# Patient Record
Sex: Female | Born: 1995 | Race: White | Hispanic: No | Smoking: Never smoker
Health system: Southern US, Community
[De-identification: ages and names within clinical notes are randomized; demographics above are authoritative.]

## PROBLEM LIST (undated history)

## (undated) DIAGNOSIS — R569 Unspecified convulsions: Secondary | ICD-10-CM

## (undated) DIAGNOSIS — D649 Anemia, unspecified: Secondary | ICD-10-CM

## (undated) DIAGNOSIS — F419 Anxiety disorder, unspecified: Secondary | ICD-10-CM

## (undated) DIAGNOSIS — K219 Gastro-esophageal reflux disease without esophagitis: Secondary | ICD-10-CM

## (undated) DIAGNOSIS — T1491XA Suicide attempt, initial encounter: Secondary | ICD-10-CM

## (undated) HISTORY — PX: NO PAST SURGERIES: SHX2092

---
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
## 2013-03-28 IMAGING — US ABDOMEN ULTRASOUND LIMITED
1 series · 14 of 23 positions shown · non-contrast
Comparison: none

REASON FOR EXAM: abd pain esp ruq and mildly elevated alk phos
COMMENTS:   Body Site: GB and Fossa, CBD, Head of Pancreas

[Series 1: abdomen ultrasound limited · 0.31mm/px · 14 of 23 slices shown]
[im 1/23]
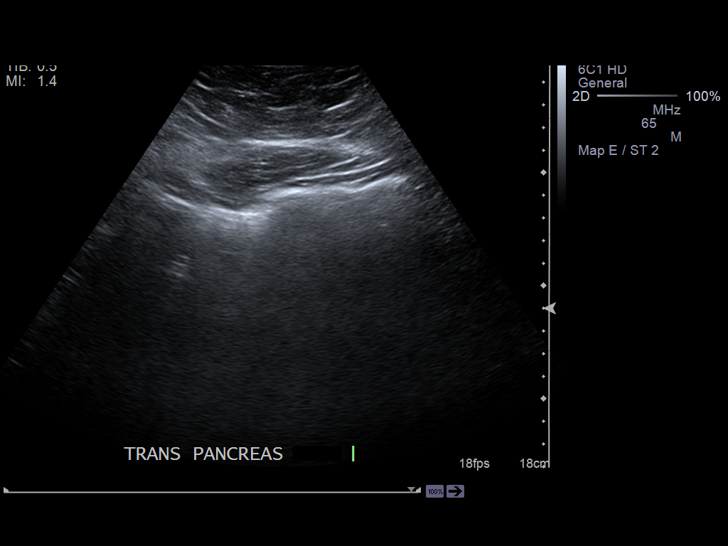
[im 3/23]
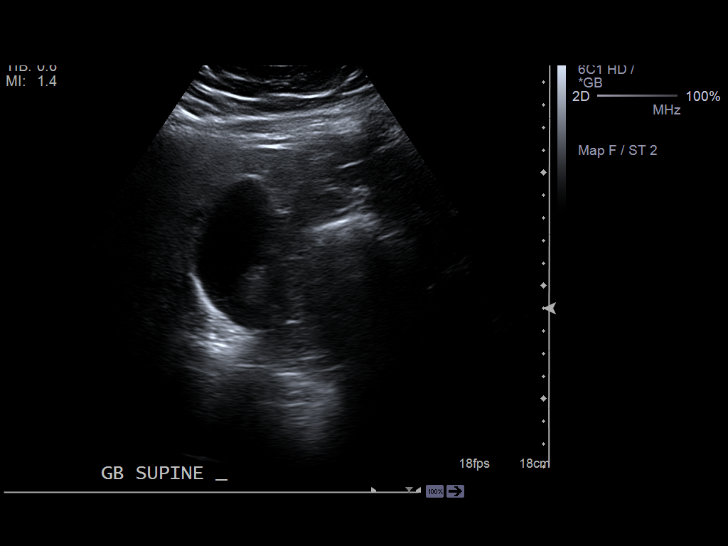
[im 5/23]
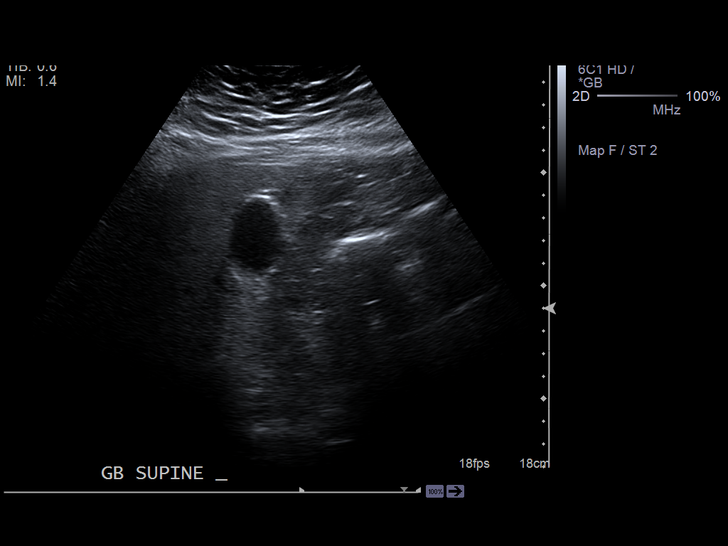
[im 6/23]
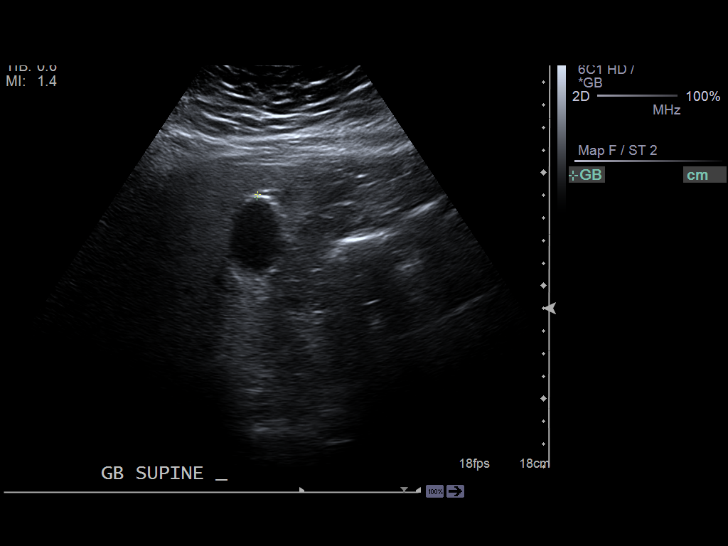
[im 8/23]
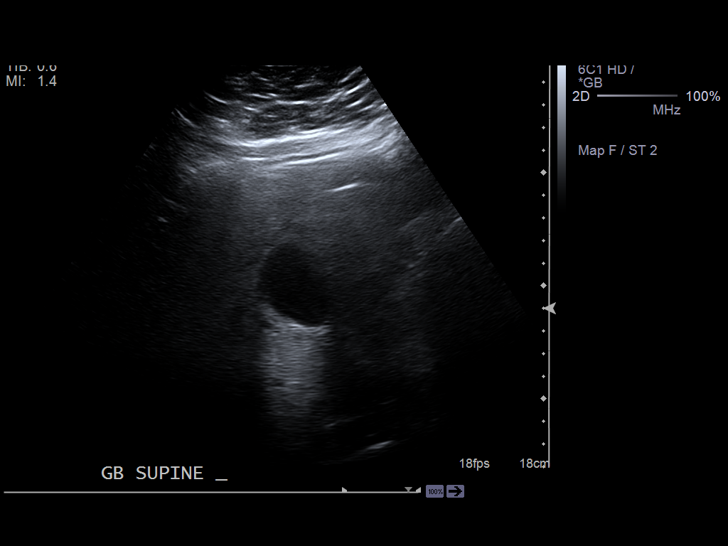
[im 10/23]
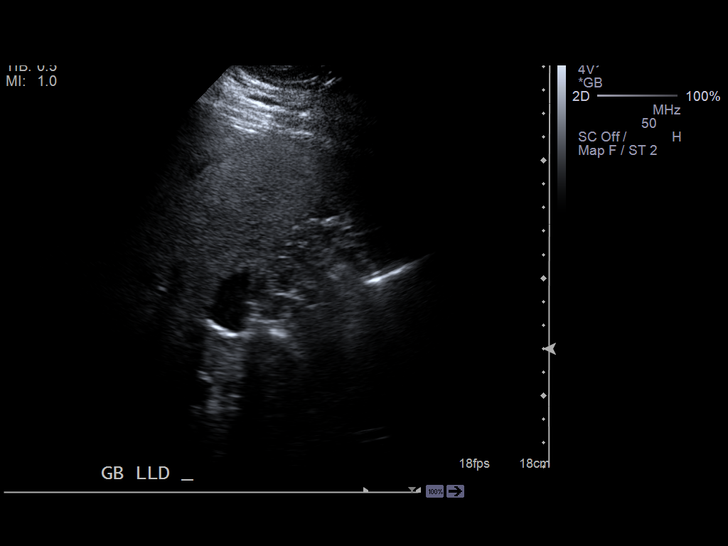
[im 11/23]
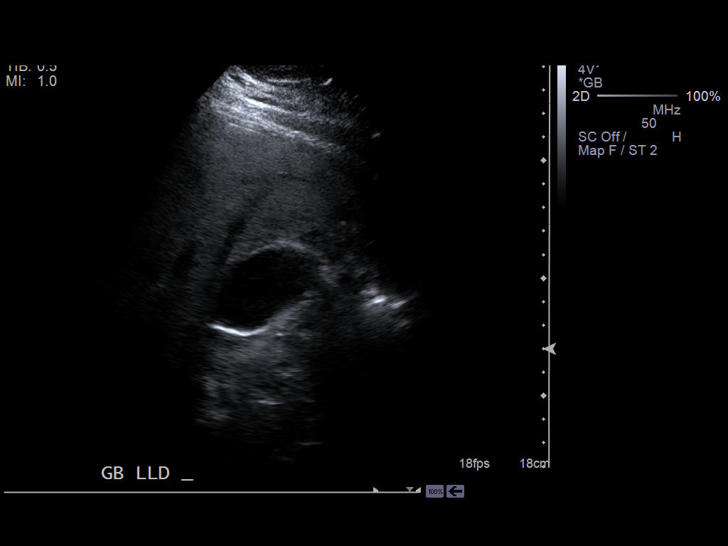
[im 13/23]
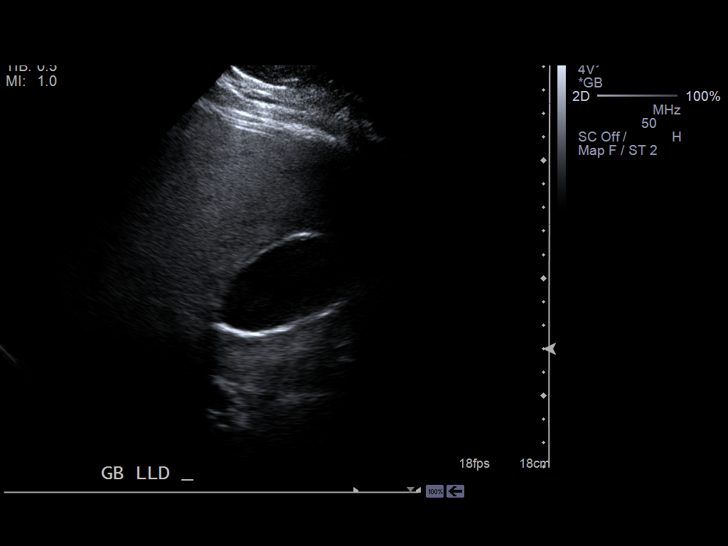
[im 14/23]
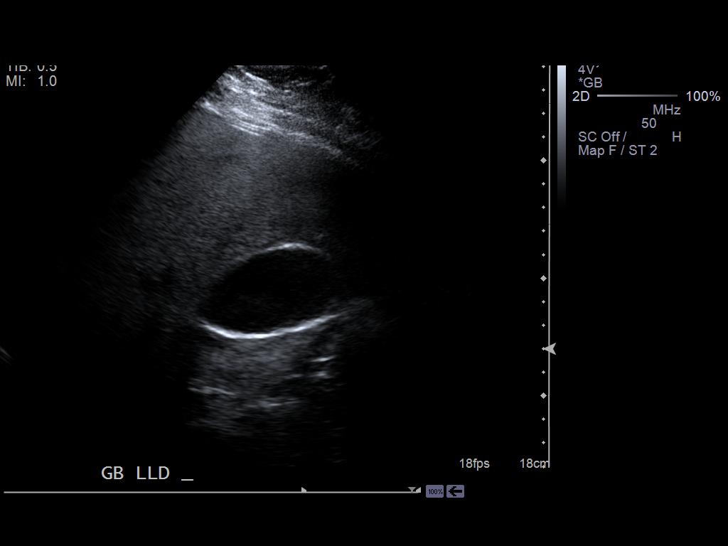
[im 16/23]
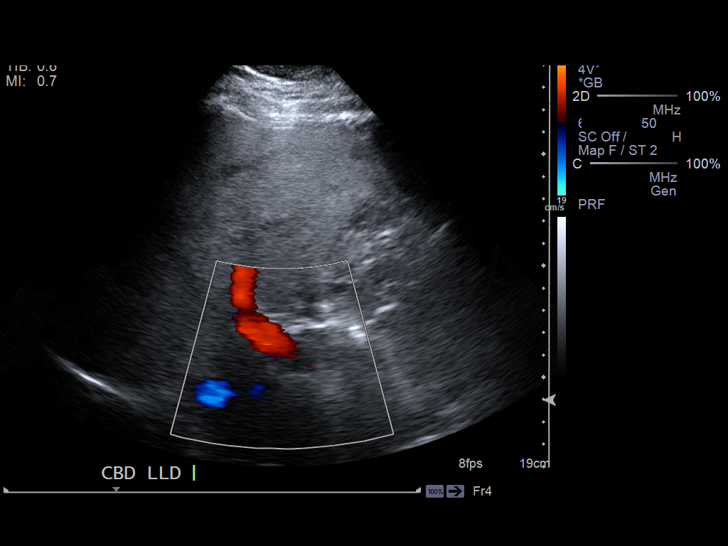
[im 18/23]
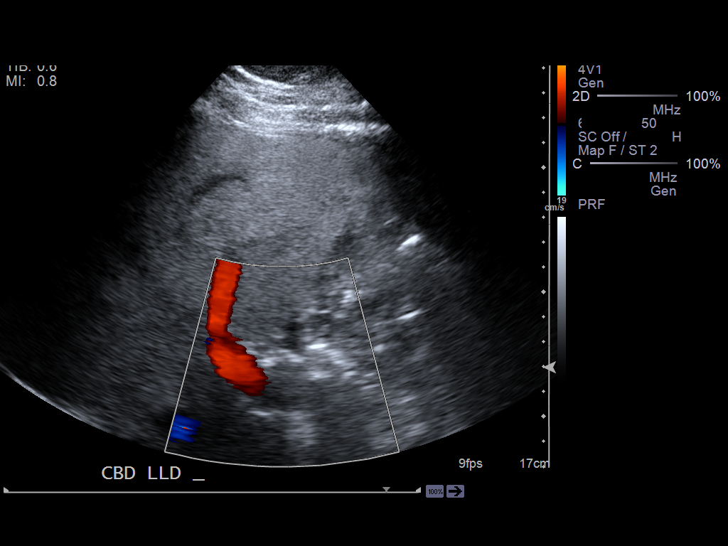
[im 19/23]
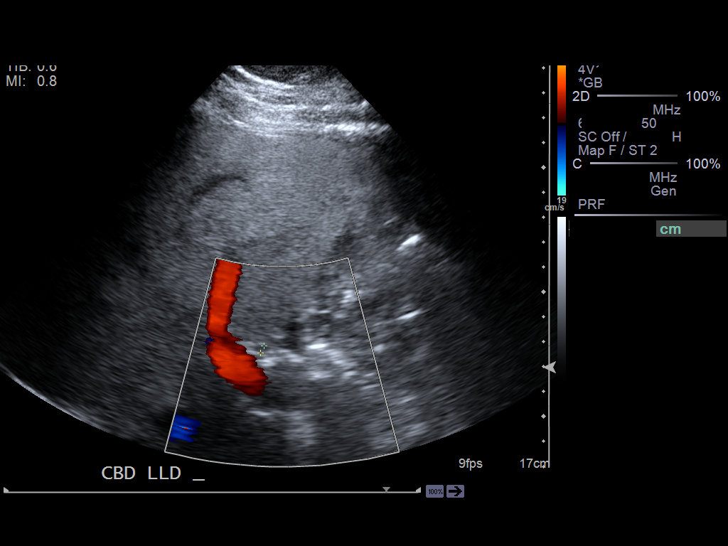
[im 21/23]
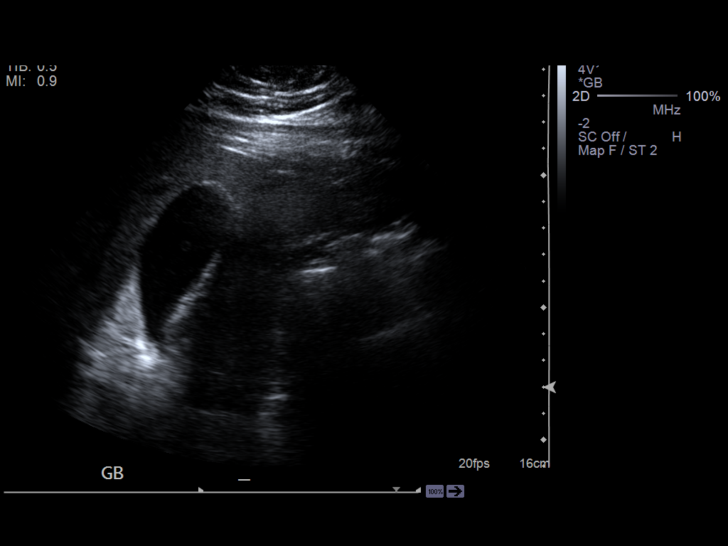
[im 23/23]
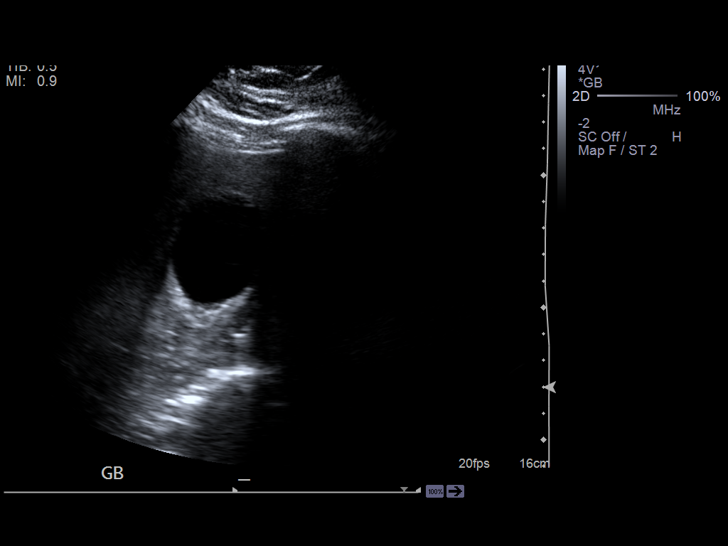

[14 of 23 positions shown; findings below may reference images not displayed]

PROCEDURE:     US  - US ABDOMEN LIMITED SURVEY  - February 10, 2012  [DATE]

RESULT:     A limited right upper quadrant ultrasound was performed.

The gallbladder is adequately distended with no evidence of stones, wall
thickening, or pericholecystic fluid. There is no positive sonographic
Murphy's sign. The common bile duct is normal at 3.2 mm in diameter. The
observed portions of the liver exhibit no acute abnormality. The pancreatic
head could not be demonstrated due to the presence of bowel gas.
IMPRESSION: The gallbladder and common bile duct are normal in
appearance. The  pancreatic head could not be demonstrated due to bowel gas.

[REDACTED]

## 2013-11-07 IMAGING — CT CT HEAD WITHOUT CONTRAST
1 series · 16 of 30 positions shown, 20 images · non-contrast
Comparison: none

REASON FOR EXAM: ha
COMMENTS:

PROCEDURE:     CT  - CT HEAD WITHOUT CONTRAST  - September 21, 2012  [DATE]
RESULT:     Comparison:  03/17/2010
TECHNIQUE: Multiple axial images from the foramen magnum to the vertex were
obtained without IV contrast.

[Series 2: soft tissue · axial · 0.39mm/px · z∈[-174,-24]mm · 16 of 34 slices shown, 20 images]
[im 2/34  brain]
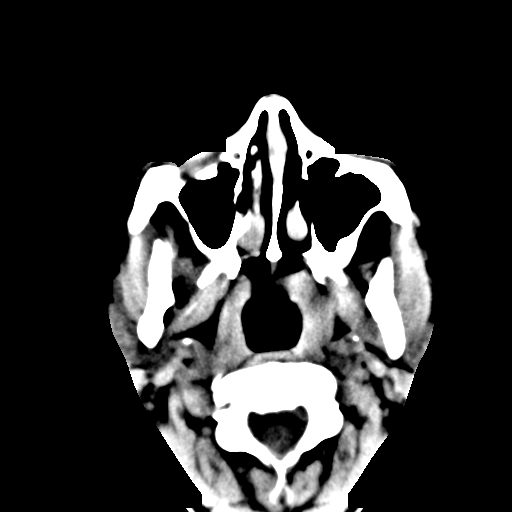
[im 2/34  bone]
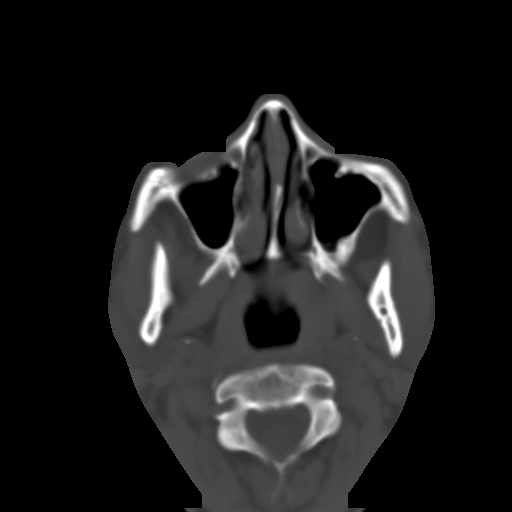
[im 4/34  brain]
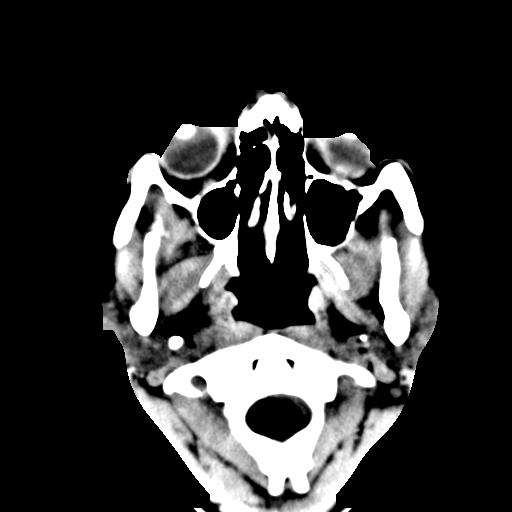
[im 6/34  brain]
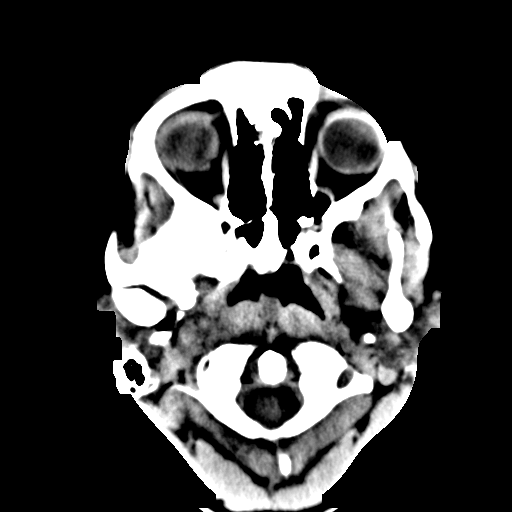
[im 8/34  brain]
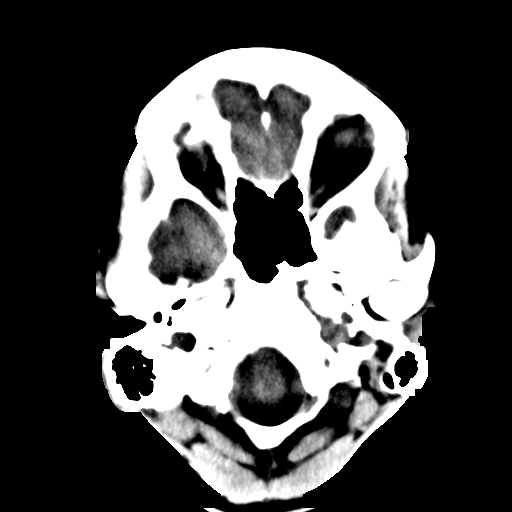
[im 10/34  brain]
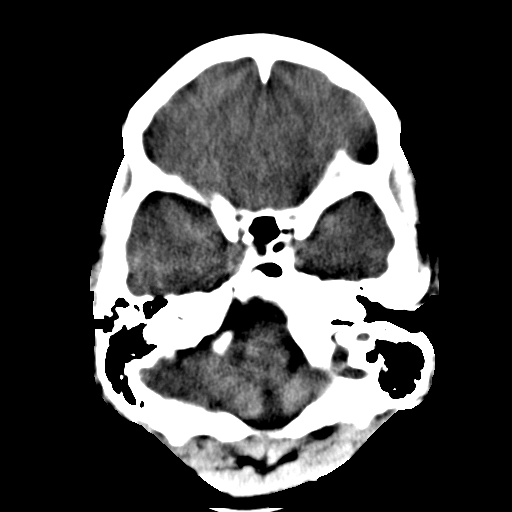
[im 10/34  bone]
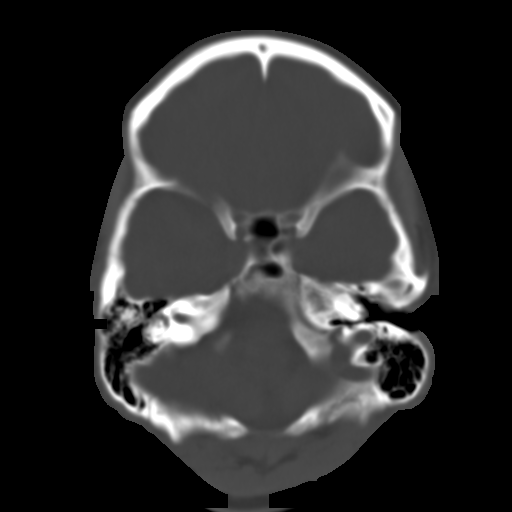
[im 12/34  brain]
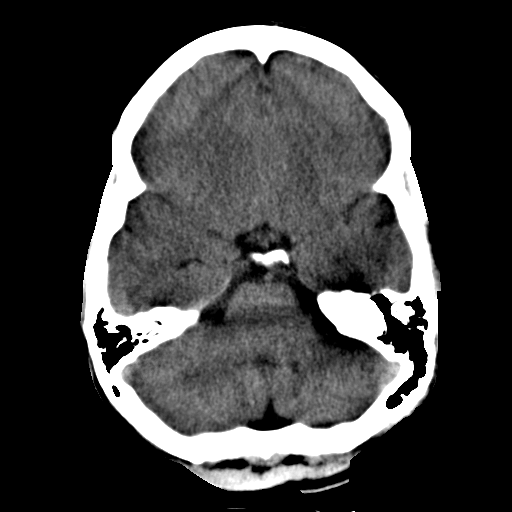
[im 14/34  brain]
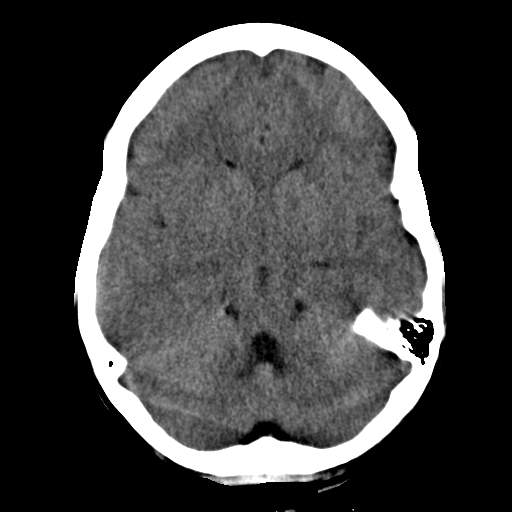
[im 16/34  brain]
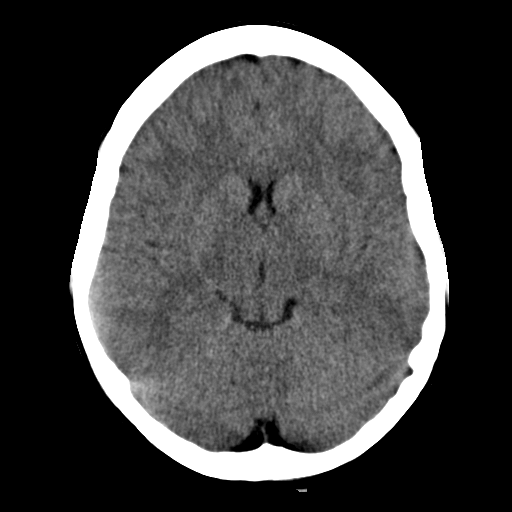
[im 18/34  brain]
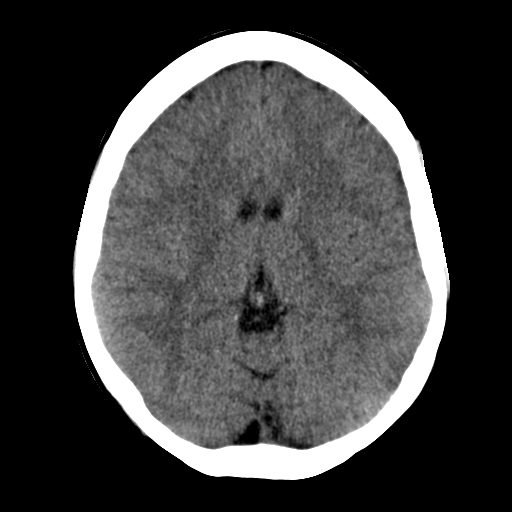
[im 18/34  bone]
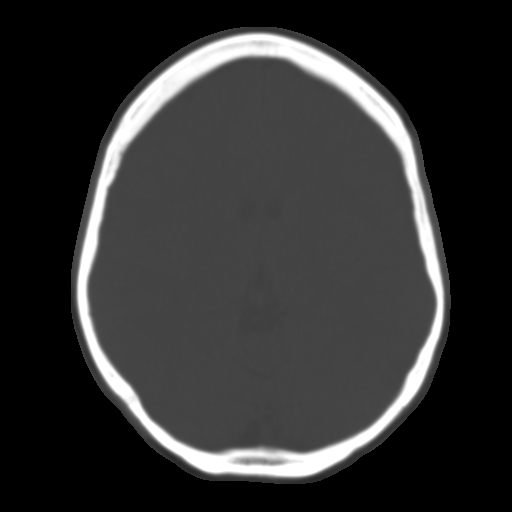
[im 20/34  brain]
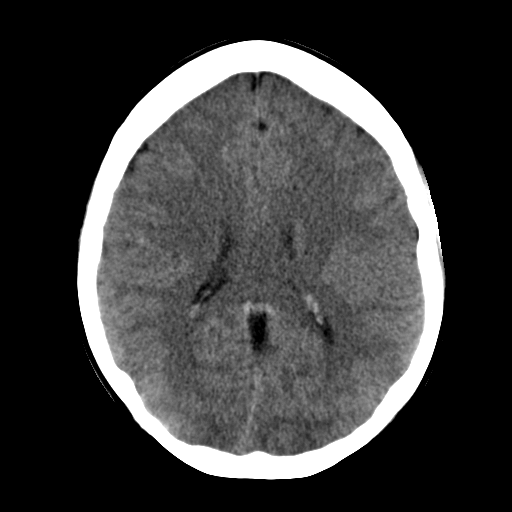
[im 22/34  brain]
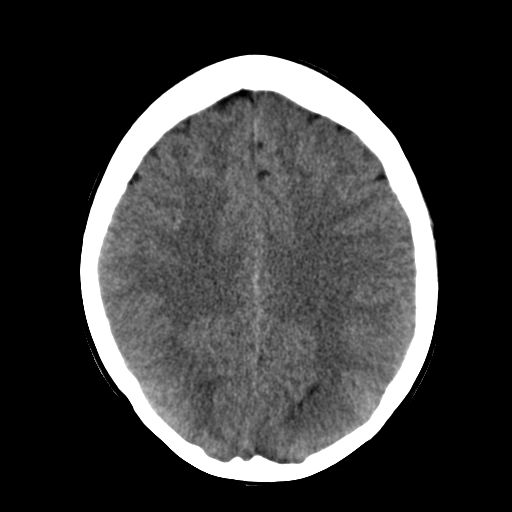
[im 24/34  brain]
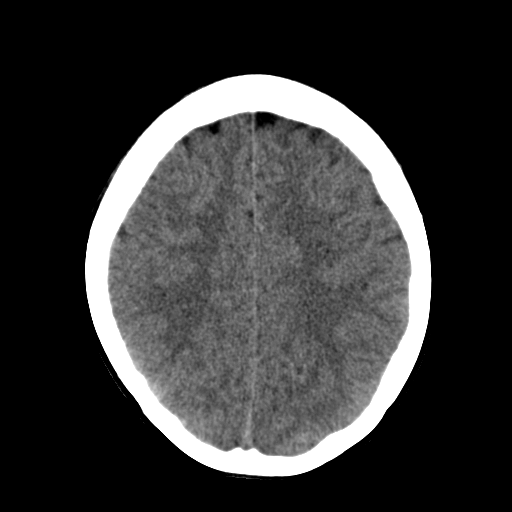
[im 26/34  brain]
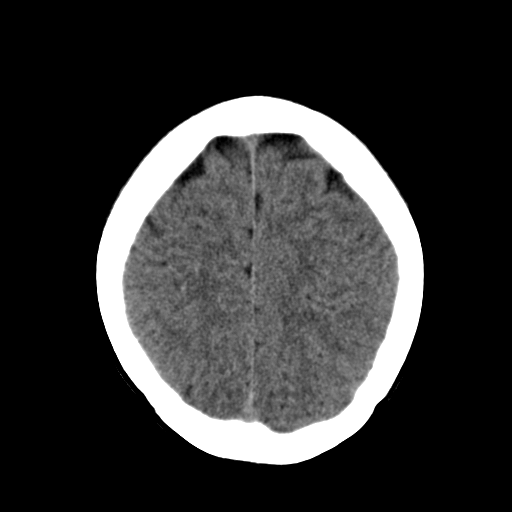
[im 26/34  bone]
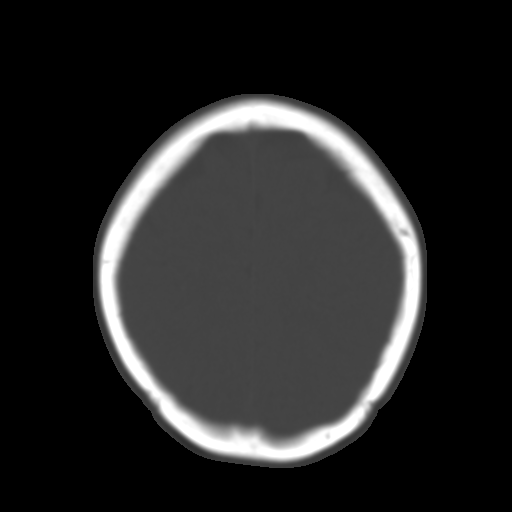
[im 28/34  brain]
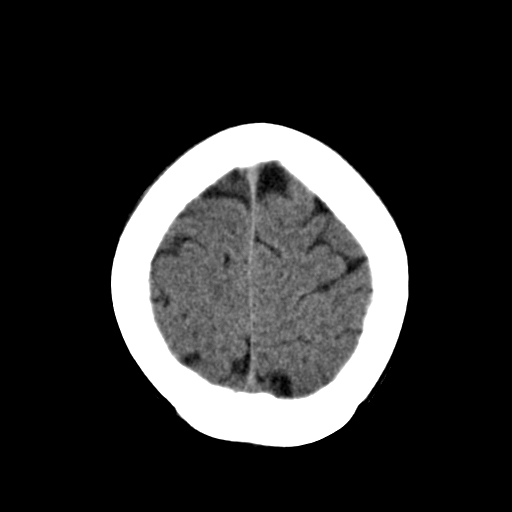
[im 30/34  brain]
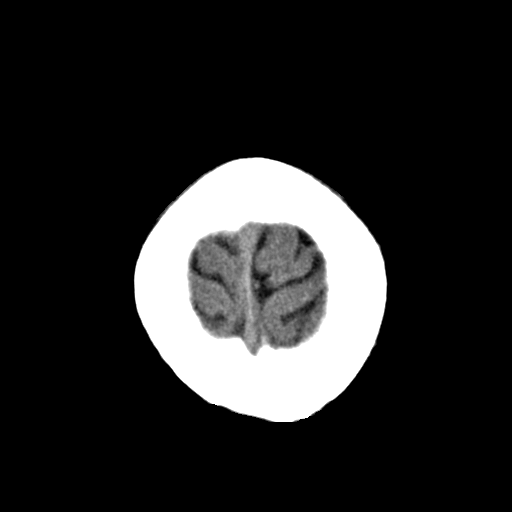
[im 32/34  brain]
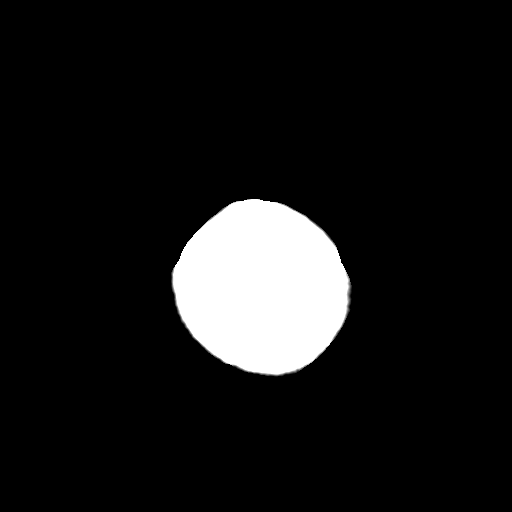

[16 of 30 positions shown; findings below may reference images not displayed]

FINDINGS: There is no evidence for mass effect, midline shift, or extra-axial fluid
collections. There is no evidence for space-occupying lesion, intracranial
hemorrhage, or cortical-based area of infarction.

Small mucus retention cyst is incompletely last in the right maxillary sinus.

The osseous structures are unremarkable.
IMPRESSION: No acute intracranial process.

## 2013-11-27 ENCOUNTER — Emergency Department: Payer: Self-pay | Admitting: Emergency Medicine

## 2013-11-27 LAB — URINALYSIS, COMPLETE
BILIRUBIN, UR: NEGATIVE
GLUCOSE, UR: NEGATIVE mg/dL (ref 0–75)
Ketone: NEGATIVE
Nitrite: NEGATIVE
Ph: 5 (ref 4.5–8.0)
Specific Gravity: 1.017 (ref 1.003–1.030)
WBC UR: 1145 /HPF (ref 0–5)

## 2013-11-27 LAB — COMPREHENSIVE METABOLIC PANEL
ALBUMIN: 4.1 g/dL (ref 3.4–5.0)
ALT: 19 U/L (ref 12–78)
Alkaline Phosphatase: 95 U/L
Anion Gap: 6 — ABNORMAL LOW (ref 7–16)
BUN: 10 mg/dL (ref 7–18)
Bilirubin,Total: 0.6 mg/dL (ref 0.2–1.0)
CHLORIDE: 112 mmol/L — AB (ref 98–107)
Calcium, Total: 8.7 mg/dL (ref 8.5–10.1)
Co2: 22 mmol/L (ref 21–32)
Creatinine: 0.78 mg/dL (ref 0.60–1.30)
EGFR (African American): >60
EGFR (Non-African Amer.): >60
GLUCOSE: 93 mg/dL (ref 65–99)
Osmolality: 278 (ref 275–301)
POTASSIUM: 3.5 mmol/L (ref 3.5–5.1)
SGOT(AST): 17 U/L (ref 15–37)
SODIUM: 140 mmol/L (ref 136–145)
TOTAL PROTEIN: 7.7 g/dL (ref 6.4–8.2)

## 2013-11-27 LAB — CBC
HCT: 41.5 % (ref 35.0–47.0)
HGB: 13.7 g/dL (ref 12.0–16.0)
MCH: 29.5 pg (ref 26.0–34.0)
MCHC: 33 g/dL (ref 32.0–36.0)
MCV: 89 fL (ref 80–100)
PLATELETS: 228 10*3/uL (ref 150–440)
RBC: 4.64 10*6/uL (ref 3.80–5.20)
RDW: 12.3 % (ref 11.5–14.5)
WBC: 11.2 10*3/uL — AB (ref 3.6–11.0)

## 2013-11-30 LAB — URINE CULTURE

## 2014-11-23 NOTE — H&P (Signed)
L&D Evaluation:  History Expanded:   HPI 19 yo G1P0 at 1039 weeks with EDD of 12/13/11 per 7 week US. [facility]  [facility], notable for early entry to care, h/o seizures without meds during pregnancy (last seizure was 2 years ago), poor weight gain.   [facility]. EFW 6#9oz, AFI 5.8 cm, S/D Ratio 3.7. Induction recommended per Dr Janene HarveyKlett.    Blood Type A positive    Group B Strep Results (Result >5wks must be treated as unknown) positive    Maternal HIV Negative    Maternal Syphilis Ab Nonreactive    Maternal Varicella Immune    Rubella Results immune    Maternal T-Dap Immune    Patient's Medical History Epilepsy  no seizure x2 years, no meds in pregnancy    Patient's Surgical History none    Medications Pre Natal Vitamins    Allergies NKDA    Social History none    Family History Non-Contributory   ROS:   ROS see HPI   Exam:   Vital Signs stable    General no apparent distress    Mental Status clear    Chest clear    Heart no murmur/gallop/rubs    Abdomen gravid, non-tender    Estimated Fetal Weight Average for gestational age    Edema no edema    Reflexes 2+    Pelvic no external lesions, 1/90/0, posterior    Mebranes Intact    FHT normal rate with no decels    Ucx irregular   Impression:   Impression IOL for oligohydramnios   Plan:   Comments Cervidil this evening, continue monitoring through afternoon d/t low AFI.  Discussed R/B/A of induction with Cervidil and Pitocin including fetal distress, tachysytole and c-section. Pt and mother in agreement with plan.   Electronic Signatures: Vella KohlerBrothers, Truxton Stupka K (CNM)  (Signed 23-May-13 15:34)  Authored: L&D Evaluation   Last Updated: 23-May-13 15:34 by Vella KohlerBrothers, Hilja Kintzel K (CNM)

## 2014-11-25 DIAGNOSIS — G40B19 Juvenile myoclonic epilepsy, intractable, without status epilepticus: Secondary | ICD-10-CM | POA: Insufficient documentation

## 2015-05-09 ENCOUNTER — Emergency Department
Admission: EM | Admit: 2015-05-09 | Discharge: 2015-05-09 | Disposition: A | Discharge status: Home / Self Care | Payer: Medicaid Other | Attending: Emergency Medicine | Admitting: Emergency Medicine

## 2015-05-09 DIAGNOSIS — O9989 Other specified diseases and conditions complicating pregnancy, childbirth and the puerperium: Secondary | ICD-10-CM | POA: Insufficient documentation

## 2015-05-09 DIAGNOSIS — Z3A01 Less than 8 weeks gestation of pregnancy: Secondary | ICD-10-CM | POA: Insufficient documentation

## 2015-05-09 DIAGNOSIS — O26891 Other specified pregnancy related conditions, first trimester: Secondary | ICD-10-CM

## 2015-05-09 DIAGNOSIS — R51 Headache: Secondary | ICD-10-CM | POA: Insufficient documentation

## 2015-05-09 HISTORY — DX: Unspecified convulsions: R56.9

## 2015-05-09 LAB — CBC WITH DIFFERENTIAL/PLATELET
BASOS ABS: 0 10*3/uL (ref 0–0.1)
BASOS PCT: 0 %
Eosinophils Absolute: 0 10*3/uL (ref 0–0.7)
Eosinophils Relative: 0 %
HEMATOCRIT: 39.3 % (ref 35.0–47.0)
HEMOGLOBIN: 13.3 g/dL (ref 12.0–16.0)
LYMPHS PCT: 14 %
Lymphs Abs: 0.8 10*3/uL — ABNORMAL LOW (ref 1.0–3.6)
MCH: 29.4 pg (ref 26.0–34.0)
MCHC: 33.8 g/dL (ref 32.0–36.0)
MCV: 87.1 fL (ref 80.0–100.0)
MONO ABS: 0.6 10*3/uL (ref 0.2–0.9)
Monocytes Relative: 10 %
NEUTROS ABS: 4.4 10*3/uL (ref 1.4–6.5)
NEUTROS PCT: 76 %
Platelets: 193 10*3/uL (ref 150–440)
RBC: 4.51 MIL/uL (ref 3.80–5.20)
RDW: 12.3 % (ref 11.5–14.5)
WBC: 5.7 10*3/uL (ref 3.6–11.0)

## 2015-05-09 LAB — URINALYSIS COMPLETE WITH MICROSCOPIC (ARMC ONLY)
Bilirubin Urine: NEGATIVE
GLUCOSE, UA: NEGATIVE mg/dL
HGB URINE DIPSTICK: NEGATIVE
Ketones, ur: NEGATIVE mg/dL
NITRITE: NEGATIVE
PH: 7 (ref 5.0–8.0)
Protein, ur: NEGATIVE mg/dL
SPECIFIC GRAVITY, URINE: 1.02 (ref 1.005–1.030)

## 2015-05-09 LAB — BASIC METABOLIC PANEL
ANION GAP: 6 (ref 5–15)
BUN: 8 mg/dL (ref 6–20)
CALCIUM: 8.9 mg/dL (ref 8.9–10.3)
CO2: 23 mmol/L (ref 22–32)
Chloride: 109 mmol/L (ref 101–111)
Creatinine, Ser: 0.61 mg/dL (ref 0.44–1.00)
GLUCOSE: 101 mg/dL — AB (ref 65–99)
POTASSIUM: 3.7 mmol/L (ref 3.5–5.1)
Sodium: 138 mmol/L (ref 135–145)

## 2015-05-09 MED ORDER — ONDANSETRON HCL 4 MG/2ML IJ SOLN
4.0000 mg | Freq: Once | INTRAMUSCULAR | Status: AC
  Administered 2015-05-09: 4 mg via INTRAVENOUS
  Filled 2015-05-09: qty 2

## 2015-05-09 MED ORDER — SODIUM CHLORIDE 0.9 % IV BOLUS (SEPSIS)
500.0000 mL | Freq: Once | INTRAVENOUS | Status: AC
  Administered 2015-05-09: 500 mL via INTRAVENOUS

## 2015-05-09 MED ORDER — BUTALBITAL-APAP-CAFFEINE 50-325-40 MG PO TABS: 1.0000 | ORAL_TABLET | Freq: Four times a day (QID) | ORAL | Status: DC | PRN

## 2015-05-09 MED ORDER — SUMATRIPTAN SUCCINATE 6 MG/0.5ML ~~LOC~~ SOLN
6.0000 mg | Freq: Once | SUBCUTANEOUS | Status: AC
  Administered 2015-05-09: 6 mg via SUBCUTANEOUS
  Filled 2015-05-09: qty 0.5

## 2015-05-09 MED ORDER — ACETAMINOPHEN-CODEINE #3 300-30 MG PO TABS
2.0000 | ORAL_TABLET | Freq: Once | ORAL | Status: AC
  Administered 2015-05-09: 2 via ORAL
  Filled 2015-05-09: qty 2

## 2015-05-09 MED ORDER — METOCLOPRAMIDE HCL 10 MG PO TABS
10.0000 mg | ORAL_TABLET | Freq: Once | ORAL | Status: AC
  Administered 2015-05-09: 10 mg via ORAL
  Filled 2015-05-09: qty 1

## 2015-05-09 NOTE — Discharge Instructions (Signed)
General Headache Without Cause A headache is pain or discomfort felt around the head or neck area. The specific cause of a headache may not be found. There are many causes and types of headaches. A few common ones are:  Tension headaches.  Migraine headaches.  Cluster headaches.  Chronic daily headaches. HOME CARE INSTRUCTIONS  Watch your condition for any changes. Take these steps to help with your condition: Managing Pain  Take over-the-counter and prescription medicines only as told by your health care provider.  Lie down in a dark, quiet room when you have a headache.  If directed, apply ice to the head and neck area:  Put ice in a plastic bag.  Place a towel between your skin and the bag.  Leave the ice on for 20 minutes, 2-3 times per day.  Use a heating pad or hot shower to apply heat to the head and neck area as told by your health care provider.  Keep lights dim if bright lights bother you or make your headaches worse. Eating and Drinking  Eat meals on a regular schedule.  Limit alcohol use.  Decrease the amount of caffeine you drink, or stop drinking caffeine. General Instructions  Keep all follow-up visits as told by your health care provider. This is important.  Keep a headache journal to help find out what may trigger your headaches. For example, write down:  What you eat and drink.  How much sleep you get.  Any change to your diet or medicines.  Try massage or other relaxation techniques.  Limit stress.  Sit up straight, and do not tense your muscles.  Do not use tobacco products, including cigarettes, chewing tobacco, or e-cigarettes. If you need help quitting, ask your health care provider.  Exercise regularly as told by your health care provider.  Sleep on a regular schedule. Get 7-9 hours of sleep, or the amount recommended by your health care provider. SEEK MEDICAL CARE IF:   Your symptoms are not helped by medicine.  You have a  headache that is different from the usual headache.  You have nausea or you vomit.  You have a fever. SEEK IMMEDIATE MEDICAL CARE IF:   Your headache becomes severe.  You have repeated vomiting.  You have a stiff neck.  You have a loss of vision.  You have problems with speech.  You have pain in the eye or ear.  You have muscular weakness or loss of muscle control.  You lose your balance or have trouble walking.  You feel faint or pass out.  You have confusion.   This information is not intended to replace advice given to you by your health care provider. Make sure you discuss any questions you have with your health care provider.   Document Released: 07/02/2005 Document Revised: 03/23/2015 Document Reviewed: 10/25/2014 Elsevier Interactive Patient Education 2016 [employer].  Migraine Headache A migraine headache is very bad, throbbing pain on one or both sides of your head. Talk to your doctor about what things may bring on (trigger) your migraine headaches. HOME CARE  Only take medicines as told by your doctor.  Lie down in a dark, quiet room when you have a migraine.  Keep a journal to find out if certain things bring on migraine headaches. For example, write down:  What you eat and drink.  How much sleep you get.  Any change to your diet or medicines.  Lessen how much alcohol you drink.  Quit smoking if you smoke.  Get enough sleep.  Lessen any stress in your life.  Keep lights dim if bright lights bother you or make your migraines worse. GET HELP RIGHT AWAY IF:   Your migraine becomes really bad.  You have a fever.  You have a stiff neck.  You have trouble seeing.  Your muscles are weak, or you lose muscle control.  You lose your balance or have trouble walking.  You feel like you will pass out (faint), or you pass out.  You have really bad symptoms that are different than your first symptoms. MAKE SURE YOU:   Understand these  instructions.  Will watch your condition.  Will get help right away if you are not doing well or get worse.   This information is not intended to replace advice given to you by your health care provider. Make sure you discuss any questions you have with your health care provider.   Document Released: 04/10/2008 Document Revised: 09/24/2011 Document Reviewed: 03/09/2013 Elsevier Interactive Patient Education [employer]2016 Elsevier Inc.

## 2015-05-09 NOTE — ED Notes (Signed)
Pt acuity was changed to a 3 per request by [place]

## 2015-05-09 NOTE — ED Provider Notes (Signed)
[facility] Emergency Department Provider Note  ____________________________________________  Time seen: Approximately 1:36 PM  I have reviewed the triage vital signs and the nursing notes.   HISTORY  Chief Complaint Headache    HPI [name] is a 19 y.o. female presents for evaluation of headache 3 days. Patient states worst headache of her life is currently [redacted] weeks pregnant. Denies any relief with over-the-counter Tylenol. Patient states nausea but no vomiting. Complains of body hurts all over.   Past Medical History  Diagnosis Date  . Seizures (HCC)     There are no active problems to display for this patient.   History reviewed. No pertinent past surgical history.  Current Outpatient Rx  Name  Route  Sig  Dispense  Refill  . butalbital-acetaminophen-caffeine (FIORICET) 50-325-40 MG tablet   Oral   Take 1-2 tablets by mouth every 6 (six) hours as needed for headache.   20 tablet   0     Allergies Review of patient's allergies indicates no known allergies.  History reviewed. No pertinent family history.  Social History Social History  Substance Use Topics  . Smoking status: Never Smoker   . Smokeless tobacco: None  . Alcohol Use: No    Review of Systems Constitutional: No fever/chills Eyes: No visual changes. ENT: No sore throat. Cardiovascular: Denies chest pain. Respiratory: Denies shortness of breath. Gastrointestinal: No abdominal pain.  No nausea, no vomiting.  No diarrhea.  No constipation. Genitourinary: Negative for dysuria. Musculoskeletal: Negative for back pain. Skin: Negative for rash. Neurological: Positive for headaches, negative for focal weakness or numbness.  10-point ROS otherwise negative.  ____________________________________________   PHYSICAL EXAM:  VITAL SIGNS: ED Triage Vitals  Enc Vitals Group     BP 05/09/15 1246 128/68 mmHg     Pulse Rate 05/09/15 1246 121     Resp 05/09/15 1246 16      Temp 05/09/15 1246 98.9 F (37.2 C)     Temp Source 05/09/15 1246 Oral     SpO2 05/09/15 1246 100 %     Weight 05/09/15 1246 155 lb (70.308 kg)     Height 05/09/15 1246  (1.575 m)     Head Cir --      Peak Flow --      Pain Score 05/09/15 1246 10     Pain Loc --      Pain Edu? --      Excl. in GC? --     Constitutional: Alert and oriented. Well appearing and in no acute distress. Eyes: Conjunctivae are normal. PERRL. EOMI. Head: Atraumatic. Nose: No congestion/rhinnorhea. Mouth/Throat: Mucous membranes are moist.  Oropharynx non-erythematous. Neck: No stridor.   Cardiovascular: Normal rate, regular rhythm. Grossly normal heart sounds.  Good peripheral circulation. Respiratory: Normal respiratory effort.  No retractions. Lungs CTAB. Gastrointestinal: Soft and nontender. No distention. No abdominal bruits. No CVA tenderness. Musculoskeletal: No lower extremity tenderness nor edema.  No joint effusions. Neurologic:  Normal speech and language. No gross focal neurologic deficits are appreciated. No gait instability. Skin:  Skin is warm, dry and intact. No rash noted. Psychiatric: Mood and affect are normal. Speech and behavior are normal.  ____________________________________________   LABS (all labs ordered are listed, but only abnormal results are displayed)  Labs Reviewed  BASIC METABOLIC PANEL - Abnormal; Notable for the following:    Glucose, Bld 101 (*)    All other components within normal limits  CBC WITH DIFFERENTIAL/PLATELET - Abnormal; Notable for the following:  Lymphs Abs 0.8 (*)    All other components within normal limits  URINALYSIS COMPLETEWITH MICROSCOPIC ([facility] ONLY) - Abnormal; Notable for the following:    Color, Urine YELLOW (*)    APPearance CLEAR (*)    Leukocytes, UA 1+ (*)    Bacteria, UA RARE (*)    Squamous Epithelial / LPF 0-5 (*)    All other components within normal limits    ____________________________________________  RADIOLOGY  deferred ____________________________________________   PROCEDURES  Procedure(s) performed: None  Critical Care performed: No  ____________________________________________   INITIAL IMPRESSION / ASSESSMENT AND PLAN / ED COURSE  Pertinent labs & imaging results that were available during my care of the patient were reviewed by me and considered in my medical decision making (see chart for details).  Acute headache first trimester pregnancy. Patient reports headache is decreased to a 5/10 upon discharge. Rx given for Fioricet one to 2 tablets every 4-6 hours when necessary for headache. Patient to follow up with PCP or OB/GYN as scheduled. She is to return to the ER with any worsening symptomology. Patient voices no other emergency medical complaints at this time. ____________________________________________   FINAL CLINICAL IMPRESSION(S) / ED DIAGNOSES  Final diagnoses:  Headache in pregnancy, antepartum, first trimester      Evangeline Dakinharles M Deby Adger, PA-C 05/09/15 1646  Rockne MenghiniAnne-Caroline Norman, MD 05/11/15 1904

## 2015-05-09 NOTE — ED Notes (Signed)
Resting, lights down, no SOB or chest pain, await relief of headache.

## 2015-05-09 NOTE — ED Notes (Addendum)
Patient has had headache since Saturday that has gotten worse today.  Patient reports taking tylenol 650mg  PO around 3am.  Patient is [redacted] weeks pregnant.

## 2015-05-17 ENCOUNTER — Ambulatory Visit (INDEPENDENT_AMBULATORY_CARE_PROVIDER_SITE_OTHER): Payer: Medicaid Other | Admitting: Obstetrics and Gynecology

## 2015-05-17 VITALS — BP 105/67 | HR 86 | Wt 150.5 lb

## 2015-05-17 DIAGNOSIS — Z113 Encounter for screening for infections with a predominantly sexual mode of transmission: Secondary | ICD-10-CM

## 2015-05-17 DIAGNOSIS — Z36 Encounter for antenatal screening of mother: Secondary | ICD-10-CM

## 2015-05-17 DIAGNOSIS — Z3687 Encounter for antenatal screening for uncertain dates: Secondary | ICD-10-CM

## 2015-05-17 DIAGNOSIS — Z1389 Encounter for screening for other disorder: Secondary | ICD-10-CM

## 2015-05-17 DIAGNOSIS — Z3490 Encounter for supervision of normal pregnancy, unspecified, unspecified trimester: Secondary | ICD-10-CM

## 2015-05-17 DIAGNOSIS — Z331 Pregnant state, incidental: Secondary | ICD-10-CM

## 2015-05-17 NOTE — Patient Instructions (Signed)

## 2015-05-17 NOTE — Progress Notes (Signed)
Chrys RacerJenna N Deanda presents for NOB nurse interview visit. G-2.  P-1001. Pregnancy confirmation done at [facility], +UPT.  Pregnancy eduction material explained and given. No cats in the home. NOB labs ordered.  HIV labs and Drug screen were explained optional and she could opt out of tests but did not decline. Drug screen ordered and sent to lab. PNV encouraged. NT to discuss with provider. Pt. To follow up with provider in 4 weeks for NOB physical.  All questions answered.   ZIKA EXPOSURE SCREEN:  The patient has not traveled to a [place] endemic area within the past 6 months, nor has she had unprotected sex with a partner who has travelled to a [place] endemic region within the past 6 months. The patient has been advised to notify us if these factors change any time during this current pregnancy, so adequate testing and monitoring can be initiated.

## 2015-05-19 LAB — RUBELLA ANTIBODY, IGM: Rubella IgM: <20 AU/mL (ref 0.0–19.9)

## 2015-05-19 LAB — MICROSCOPIC EXAMINATION
Casts: NONE SEEN /lpf
Epithelial Cells (non renal): >10 /hpf — AB (ref 0–10)
WBC, UA: >30 /hpf — AB (ref 0–?)

## 2015-05-19 LAB — CBC WITH DIFFERENTIAL/PLATELET
BASOS: 0 %
Basophils Absolute: 0 10*3/uL (ref 0.0–0.2)
EOS (ABSOLUTE): 0 10*3/uL (ref 0.0–0.4)
EOS: 1 %
HEMATOCRIT: 40.5 % (ref 34.0–46.6)
HEMOGLOBIN: 12.9 g/dL (ref 11.1–15.9)
IMMATURE GRANS (ABS): 0 10*3/uL (ref 0.0–0.1)
IMMATURE GRANULOCYTES: 0 %
LYMPHS: 32 %
Lymphocytes Absolute: 1.7 10*3/uL (ref 0.7–3.1)
MCH: 28.6 pg (ref 26.6–33.0)
MCHC: 31.9 g/dL (ref 31.5–35.7)
MCV: 90 fL (ref 79–97)
MONOCYTES: 9 %
Monocytes Absolute: 0.4 10*3/uL (ref 0.1–0.9)
NEUTROS PCT: 58 %
Neutrophils Absolute: 3 10*3/uL (ref 1.4–7.0)
PLATELETS: 266 10*3/uL (ref 150–379)
RBC: 4.51 x10E6/uL (ref 3.77–5.28)
RDW: 13 % (ref 12.3–15.4)
WBC: 5.1 10*3/uL (ref 3.4–10.8)

## 2015-05-19 LAB — URINALYSIS, ROUTINE W REFLEX MICROSCOPIC
BILIRUBIN UA: NEGATIVE
Glucose, UA: NEGATIVE
KETONES UA: NEGATIVE
Nitrite, UA: NEGATIVE
SPEC GRAV UA: 1.028 (ref 1.005–1.030)
Urobilinogen, Ur: 0.2 mg/dL (ref 0.2–1.0)
pH, UA: 6 (ref 5.0–7.5)

## 2015-05-19 LAB — HIV ANTIBODY (ROUTINE TESTING W REFLEX): HIV Screen 4th Generation wRfx: NONREACTIVE

## 2015-05-19 LAB — HEPATITIS B SURFACE ANTIGEN: HEP B S AG: NEGATIVE

## 2015-05-19 LAB — RPR: RPR Ser Ql: NONREACTIVE

## 2015-05-19 LAB — ABO

## 2015-05-19 LAB — NICOTINE SCREEN, URINE: Cotinine Ql Scrn, Ur: NEGATIVE ng/mL

## 2015-05-19 LAB — RH TYPE: RH TYPE: POSITIVE

## 2015-05-19 LAB — VARICELLA ZOSTER ANTIBODY, IGM: Varicella IgM: <0.91 index (ref 0.00–0.90)

## 2015-05-19 LAB — ANTIBODY SCREEN: Antibody Screen: NEGATIVE

## 2015-05-20 LAB — GC/CHLAMYDIA PROBE AMP
CHLAMYDIA, DNA PROBE: NEGATIVE
NEISSERIA GONORRHOEAE BY PCR: NEGATIVE

## 2015-05-20 LAB — CULTURE, OB URINE

## 2015-05-20 LAB — URINE CULTURE, OB REFLEX

## 2015-05-23 ENCOUNTER — Other Ambulatory Visit: Payer: Self-pay | Admitting: Obstetrics and Gynecology

## 2015-05-23 DIAGNOSIS — Z2839 Other underimmunization status: Secondary | ICD-10-CM

## 2015-05-23 DIAGNOSIS — O9989 Other specified diseases and conditions complicating pregnancy, childbirth and the puerperium: Principal | ICD-10-CM

## 2015-05-23 DIAGNOSIS — O09899 Supervision of other high risk pregnancies, unspecified trimester: Secondary | ICD-10-CM

## 2015-05-23 DIAGNOSIS — Z283 Underimmunization status: Secondary | ICD-10-CM

## 2015-05-25 ENCOUNTER — Ambulatory Visit: Payer: Medicaid Other

## 2015-05-25 DIAGNOSIS — Z331 Pregnant state, incidental: Secondary | ICD-10-CM | POA: Diagnosis not present

## 2015-05-25 DIAGNOSIS — Z3490 Encounter for supervision of normal pregnancy, unspecified, unspecified trimester: Secondary | ICD-10-CM

## 2015-05-25 DIAGNOSIS — Z36 Encounter for antenatal screening of mother: Secondary | ICD-10-CM | POA: Diagnosis not present

## 2015-05-25 DIAGNOSIS — Z3687 Encounter for antenatal screening for uncertain dates: Secondary | ICD-10-CM

## 2015-06-16 ENCOUNTER — Telehealth: Payer: Self-pay | Admitting: Obstetrics and Gynecology

## 2015-06-16 NOTE — Telephone Encounter (Signed)
What would you prefer?

## 2015-06-16 NOTE — Telephone Encounter (Signed)
Pt caklled and she is about [redacted] weeks pregnant and wanted to know if we had any sample for nausea and vomiting or if we can call something in to her pharmacy. She uses [facility]

## 2015-06-17 ENCOUNTER — Other Ambulatory Visit: Payer: Self-pay | Admitting: Obstetrics and Gynecology

## 2015-06-17 MED ORDER — PROMETHAZINE HCL 25 MG PO TABS: 25.0000 mg | ORAL_TABLET | Freq: Four times a day (QID) | ORAL | Status: DC | PRN

## 2015-06-17 MED ORDER — DOXYLAMINE-PYRIDOXINE 10-10 MG PO TBEC: 2.0000 | DELAYED_RELEASE_TABLET | Freq: Every day | ORAL | Status: DC

## 2015-06-17 NOTE — Telephone Encounter (Signed)
Please fax printed rx's and send discount card for diclegis, as I didn't see insurance listed in her chart.

## 2015-06-20 NOTE — Telephone Encounter (Signed)
[facility]

## 2015-06-21 ENCOUNTER — Ambulatory Visit (INDEPENDENT_AMBULATORY_CARE_PROVIDER_SITE_OTHER): Payer: Medicaid Other | Admitting: Obstetrics and Gynecology

## 2015-06-21 ENCOUNTER — Encounter: Payer: Self-pay | Admitting: Obstetrics and Gynecology

## 2015-06-21 VITALS — BP 120/72 | HR 95 | Wt 150.9 lb

## 2015-06-21 DIAGNOSIS — Z3491 Encounter for supervision of normal pregnancy, unspecified, first trimester: Secondary | ICD-10-CM | POA: Diagnosis not present

## 2015-06-21 LAB — POCT URINALYSIS DIPSTICK
Bilirubin, UA: NEGATIVE
Blood, UA: NEGATIVE
GLUCOSE UA: NEGATIVE
KETONES UA: NEGATIVE
Nitrite, UA: NEGATIVE
PH UA: 7
SPEC GRAV UA: 1.01
UROBILINOGEN UA: 0.2

## 2015-06-21 MED ORDER — DOXYLAMINE-PYRIDOXINE 10-10 MG PO TBEC: 2.0000 | DELAYED_RELEASE_TABLET | Freq: Every day | ORAL | Status: DC

## 2015-06-21 NOTE — Patient Instructions (Signed)
Thank you for enrolling in MyChart. Please follow the instructions below to securely access your online medical record. MyChart allows you to send messages to your doctor, view your test results, renew your prescriptions, schedule appointments, and more.  How Do I Sign Up? 1. In your Internet browser, go to [email or web address] 2. Click on the New  User? link in the Sign In box.  3. Enter your MyChart Access Code exactly as it appears below. You will not need to use this code after you have completed the sign-up process. If you do not sign up before the expiration date, you must request a new code. MyChart Access Code: [email or web address] Expires: 07/04/2015  8:38 AM  4. Enter the last four digits of your Social Security Number (xxxx) and Date of Birth (mm/dd/yyyy) as indicated and click Next. You will be taken to the next sign-up page. 5. Create a MyChart ID. This will be your MyChart login ID and cannot be changed, so think of one that is secure and easy to remember. 6. Create a MyChart password. You can change your password at any time. 7. Enter your Password Reset Question and Answer and click Next. This can be used at a later time if you forget your password.  8. Select your communication preference, and if applicable enter your e-mail address. You will receive e-mail notification when new information is available in MyChart by choosing to receive e-mail notifications and filling in your e-mail. 9. Click Sign In. You can now view your medical record.   Additional Information If you have questions, you can email [email or web address] or call [phone] to talk to our MyChart staff. Remember, MyChart is NOT to be used for urgent needs. For medical emergencies, dial 911.    Second Trimester of Pregnancy The second trimester is from week 13 through week 28, months 4 through 6. The second trimester is often a time when you feel your best. Your body has also adjusted  to being pregnant, and you begin to feel better physically. Usually, morning sickness has lessened or quit completely, you may have more energy, and you may have an increase in appetite. The second trimester is also a time when the fetus is growing rapidly. At the end of the sixth month, the fetus is about 9 inches long and weighs about 1 pounds. You will likely begin to feel the baby move (quickening) between 18 and 20 weeks of the pregnancy. BODY CHANGES Your body goes through many changes during pregnancy. The changes vary from woman to woman.   Your weight will continue to increase. You will notice your lower abdomen bulging out.  You may begin to get stretch marks on your hips, abdomen, and breasts.  You may develop headaches that can be relieved by medicines approved by your health care provider.  You may urinate more often because the fetus is pressing on your bladder.  You may develop or continue to have heartburn as a result of your pregnancy.  You may develop constipation because certain hormones are causing the muscles that push waste through your intestines to slow down.  You may develop hemorrhoids or swollen, bulging veins (varicose veins).  You may have back pain because of the weight gain and pregnancy hormones relaxing your joints between the bones in your pelvis and as a result of a shift in weight and the muscles that support your balance.  Your breasts will continue to grow and be tender.  Your gums may  bleed and may be sensitive to brushing and flossing.  Dark spots or blotches (chloasma, mask of pregnancy) may develop on your face. This will likely fade after the baby is born.  A dark line from your belly button to the pubic area (linea nigra) may appear. This will likely fade after the baby is born.  You may have changes in your hair. These can include thickening of your hair, rapid growth, and changes in texture. Some women also have hair loss during or after  pregnancy, or hair that feels dry or thin. Your hair will most likely return to normal after your baby is born. WHAT TO EXPECT AT YOUR PRENATAL VISITS During a routine prenatal visit:  You will be weighed to make sure you and the fetus are growing normally.  Your blood pressure will be taken.  Your abdomen will be measured to track your baby's growth.  The fetal heartbeat will be listened to.  Any test results from the previous visit will be discussed. Your health care provider may ask you:  How you are feeling.  If you are feeling the baby move.  If you have had any abnormal symptoms, such as leaking fluid, bleeding, severe headaches, or abdominal cramping.  If you are using any tobacco products, including cigarettes, chewing tobacco, and electronic cigarettes.  If you have any questions. Other tests that may be performed during your second trimester include:  Blood tests that check for:  Low iron levels (anemia).  Gestational diabetes (between 24 and 28 weeks).  Rh antibodies.  Urine tests to check for infections, diabetes, or protein in the urine.  An ultrasound to confirm the proper growth and development of the baby.  An amniocentesis to check for possible genetic problems.  Fetal screens for spina bifida and Down syndrome.  HIV (human immunodeficiency virus) testing. Routine prenatal testing includes screening for HIV, unless you choose not to have this test. HOME CARE INSTRUCTIONS   Avoid all smoking, herbs, alcohol, and unprescribed drugs. These chemicals affect the formation and growth of the baby.  Do not use any tobacco products, including cigarettes, chewing tobacco, and electronic cigarettes. If you need help quitting, ask your health care provider. You may receive counseling support and other resources to help you quit.  Follow your health care provider's instructions regarding medicine use. There are medicines that are either safe or unsafe to take  during pregnancy.  Exercise only as directed by your health care provider. Experiencing uterine cramps is a good sign to stop exercising.  Continue to eat regular, healthy meals.  Wear a good support bra for breast tenderness.  Do not use hot tubs, steam rooms, or saunas.  Wear your seat belt at all times when driving.  Avoid raw meat, uncooked cheese, cat litter boxes, and soil used by cats. These carry germs that can cause birth defects in the baby.  Take your prenatal vitamins.  Take 1500-2000 mg of calcium daily starting at the 20th week of pregnancy until you deliver your baby.  Try taking a stool softener (if your health care provider approves) if you develop constipation. Eat more high-fiber foods, such as fresh vegetables or fruit and whole grains. Drink plenty of fluids to keep your urine clear or pale yellow.  Take warm sitz baths to soothe any pain or discomfort caused by hemorrhoids. Use hemorrhoid cream if your health care provider approves.  If you develop varicose veins, wear support hose. Elevate your feet for 15 minutes, 3-4 times a  day. Limit salt in your diet.  Avoid heavy lifting, wear low heel shoes, and practice good posture.  Rest with your legs elevated if you have leg cramps or low back pain.  Visit your dentist if you have not gone yet during your pregnancy. Use a soft toothbrush to brush your teeth and be gentle when you floss.  A sexual relationship may be continued unless your health care provider directs you otherwise.  Continue to go to all your prenatal visits as directed by your health care provider. SEEK MEDICAL CARE IF:   You have dizziness.  You have mild pelvic cramps, pelvic pressure, or nagging pain in the abdominal area.  You have persistent nausea, vomiting, or diarrhea.  You have a bad smelling vaginal discharge.  You have pain with urination. SEEK IMMEDIATE MEDICAL CARE IF:   You have a fever.  You are leaking fluid from your  vagina.  You have spotting or bleeding from your vagina.  You have severe abdominal cramping or pain.  You have rapid weight gain or loss.  You have shortness of breath with chest pain.  You notice sudden or extreme swelling of your face, hands, ankles, feet, or legs.  You have not felt your baby move in over an hour.  You have severe headaches that do not go away with medicine.  You have vision changes.   This information is not intended to replace advice given to you by your health care provider. Make sure you discuss any questions you have with your health care provider.   Document Released: 06/26/2001 Document Revised: 07/23/2014 Document Reviewed: 09/02/2012 Elsevier Interactive Patient Education [employer] Elsevier Inc.

## 2015-06-21 NOTE — Progress Notes (Signed)
NEW OB HISTORY AND PHYSICAL  SUBJECTIVE:       Kathleen RacerJenna N Toves is a 19 y.o. [id number] female, Patient's last menstrual period was 04/01/2015 (approximate)., Estimated Date of Delivery: 01/06/16, [id number], presents today for establishment of Prenatal Care. She has no unusual complaints and complains of nausea and vomiting daily- helped with diclegis      Gynecologic History Patient's last menstrual period was 04/01/2015 (approximate). Normal Contraception: none Last Pap: 02/2015. Results were: normal  Obstetric History OB History  Gravida Para Term Preterm AB SAB TAB Ectopic Multiple Living  2 1 1       1     # Outcome Date GA Lbr Len/2nd Weight Sex Delivery Anes PTL Lv  2 Current           1 Term 12/07/11 430w0d  6 lb 13 oz (3.09 kg) F Vag-Spont  N Y     Complications: Oligohydramnios      Past Medical History  Diagnosis Date  . Seizures (HCC)     History reviewed. No pertinent past surgical history.  Current Outpatient Prescriptions on File Prior to Visit  Medication Sig Dispense Refill  . Doxylamine-Pyridoxine (DICLEGIS) 10-10 MG TBEC Take 2 tablets by mouth at bedtime. If symptoms persist, add one tablet in the morning and one in the afternoon 100 tablet 5  . Prenatal MV-Min-Fe Fum-FA-DHA (PRENATAL 1 PO) Take 1 tablet by mouth daily.    . butalbital-acetaminophen-caffeine (FIORICET) 50-325-40 MG tablet Take 1-2 tablets by mouth every 6 (six) hours as needed for headache. (Patient not taking: Reported on 06/21/2015) 20 tablet 0  . promethazine (PHENERGAN) 25 MG tablet Take 1 tablet (25 mg total) by mouth every 6 (six) hours as needed for nausea or vomiting. (Patient not taking: Reported on 06/21/2015) 30 tablet 2  . topiramate (TOPAMAX) 100 MG tablet Take 100 mg by mouth.     No current facility-administered medications on file prior to visit.    Allergies  Allergen Reactions  . Phenytoin Anaphylaxis and Itching    Social History   Social History  . Marital Status: Unknown     Spouse Name: N/A  . Number of Children: N/A  . Years of Education: N/A   Occupational History  . homemaker    Social History Main Topics  . Smoking status: Never Smoker   . Smokeless tobacco: Never Used  . Alcohol Use: No  . Drug Use: No  . Sexual Activity:    Partners: Male   Other Topics Concern  . Not on file   Social History Narrative    Family History  Problem Relation Age of Onset  . Breast cancer Maternal Grandmother   . Diabetes Mother   . Seizures Cousin     maternal  . Stroke Cousin     maternal  . Autism Paternal Grandfather     The following portions of the patient's history were reviewed and updated as appropriate: allergies, current medications, past OB history, past medical history, past surgical history, past family history, past social history, and problem list.    OBJECTIVE: Initial Physical Exam (New OB)  GENERAL APPEARANCE: alert, well appearing, in no apparent distress, oriented to person, place and time HEAD: normocephalic, atraumatic MOUTH: mucous membranes moist, pharynx normal without lesions THYROID: no thyromegaly or masses present BREASTS: not examined LUNGS: clear to auscultation, no wheezes, rales or rhonchi, symmetric air entry HEART: regular rate and rhythm, no murmurs ABDOMEN: soft, nontender, nondistended, no abnormal masses, no epigastric pain, fundus not palpable  and FHT present EXTREMITIES: no redness or tenderness in the calves or thighs SKIN: normal coloration and turgor, no rashes LYMPH NODES: no adenopathy palpable NEUROLOGIC: alert, oriented, normal speech, no focal findings or movement disorder noted  PELVIC EXAM EXTERNAL GENITALIA: normal appearing vulva with no masses, tenderness or lesions, Not examined  ASSESSMENT: Normal pregnancy Nausea and vomiting  PLAN: Prenatal care Declined genetic screening See orders

## 2015-06-21 NOTE — Progress Notes (Signed)
NOB physical-c/o nausea she is taking diclegis

## 2015-07-12 ENCOUNTER — Telehealth: Payer: Self-pay | Admitting: Obstetrics and Gynecology

## 2015-07-12 NOTE — Telephone Encounter (Signed)
Pt called and she was given diclegis samples and she is out but when she went to get the RX that was called in she was told from the pharmacy that medicaid has not approved it, can we check on this and she wanted to know if there was something else that could be called in for to help. She asked for some samples but i told her i didn't think we had any for the diclegis.

## 2015-07-17 NOTE — L&D Delivery Note (Signed)
Delivery Note At  a viable and healthy female "Monica MartinezRemy" was delivered via  (Presentation:OA ;  ).  APGAR: 8 ,9 ; weight  .  6#11oz Placenta status:delivered intact with 3 vessel  Cord:  with the following complications: none  Anesthesia:  epidural Episiotomy:  none Lacerations:  1st Suture Repair: 3.0 vicryl rapide Est. Blood Loss (mL):  200  Mom to postpartum.  Baby to Couplet care / Skin to Skin.  Kashus Karlen N Ellyn Rubiano 12/26/2015, 3:38 PM

## 2015-07-19 ENCOUNTER — Encounter: Payer: Self-pay | Admitting: Obstetrics and Gynecology

## 2015-07-19 ENCOUNTER — Ambulatory Visit (INDEPENDENT_AMBULATORY_CARE_PROVIDER_SITE_OTHER): Payer: Medicaid Other | Admitting: Obstetrics and Gynecology

## 2015-07-19 VITALS — BP 117/74 | HR 108 | Wt 150.5 lb

## 2015-07-19 DIAGNOSIS — Z331 Pregnant state, incidental: Secondary | ICD-10-CM | POA: Diagnosis not present

## 2015-07-19 LAB — POCT URINALYSIS DIPSTICK
BILIRUBIN UA: NEGATIVE
GLUCOSE UA: NEGATIVE
Ketones, UA: NEGATIVE
Nitrite, UA: NEGATIVE
RBC UA: NEGATIVE
SPEC GRAV UA: 1.02
Urobilinogen, UA: 0.2
pH, UA: 6

## 2015-07-19 MED ORDER — INFLUENZA VAC SPLIT QUAD 0.5 ML IM SUSY
0.5000 mL | PREFILLED_SYRINGE | Freq: Once | INTRAMUSCULAR | Status: AC
  Administered 2015-07-19: 0.5 mL via INTRAMUSCULAR

## 2015-07-19 NOTE — Telephone Encounter (Signed)
Pt came in 07/18/14 gave samples of diclegis

## 2015-07-19 NOTE — Progress Notes (Signed)
NOB-pt states her morning sickness has gotten some better

## 2015-07-19 NOTE — Progress Notes (Signed)
ROB-doing better, anatomy scan next visit. 

## 2015-08-01 ENCOUNTER — Emergency Department
Admission: EM | Admit: 2015-08-01 | Discharge: 2015-08-01 | Disposition: A | Discharge status: Home / Self Care | Payer: Medicaid Other | Attending: Emergency Medicine | Admitting: Emergency Medicine

## 2015-08-01 ENCOUNTER — Encounter: Payer: Self-pay | Admitting: Emergency Medicine

## 2015-08-01 DIAGNOSIS — O99512 Diseases of the respiratory system complicating pregnancy, second trimester: Secondary | ICD-10-CM | POA: Diagnosis not present

## 2015-08-01 DIAGNOSIS — Z3A17 17 weeks gestation of pregnancy: Secondary | ICD-10-CM | POA: Insufficient documentation

## 2015-08-01 DIAGNOSIS — Z79899 Other long term (current) drug therapy: Secondary | ICD-10-CM | POA: Insufficient documentation

## 2015-08-01 DIAGNOSIS — J069 Acute upper respiratory infection, unspecified: Secondary | ICD-10-CM | POA: Diagnosis not present

## 2015-08-01 DIAGNOSIS — O9989 Other specified diseases and conditions complicating pregnancy, childbirth and the puerperium: Secondary | ICD-10-CM | POA: Diagnosis present

## 2015-08-01 DIAGNOSIS — Z3490 Encounter for supervision of normal pregnancy, unspecified, unspecified trimester: Secondary | ICD-10-CM

## 2015-08-01 MED ORDER — FLUTICASONE PROPIONATE 50 MCG/ACT NA SUSP: 1.0000 | Freq: Two times a day (BID) | NASAL | Status: DC

## 2015-08-01 NOTE — ED Provider Notes (Signed)
[facility] Emergency Department Provider Note  ____________________________________________  Time seen: Approximately 1:06 PM  I have reviewed the triage vital signs and the nursing notes.   HISTORY  Chief Complaint Sore Throat; Cough; and Nasal Congestion    HPI Kathleen Mcgrath is a 20 y.o. female who presents to the emergency department complaining of nasal congestion, cough, sore throat 4 days. Patient states that she is [redacted] weeks pregnant has only been taking Tylenol for symptom relief. Tylenol alone is not improved symptoms. She denies any fevers or chills. She denies any headache, visual acuity changes, neck pain, chest pain, shortness of breath, abdominal pain, area constipation. Patient does have morning sickness but states there is no change from baseline. Patient is tolerating oral fluids and solids appropriately.   Past Medical History  Diagnosis Date  . Seizures [facility])     Patient Active Problem List   Diagnosis Date Noted  . Rubella non-immune status, antepartum 05/23/2015  . Maternal varicella, non-immune 05/23/2015  . 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 No pertinent past surgical history.  Current Outpatient Rx  Name  Route  Sig  Dispense  Refill  . butalbital-acetaminophen-caffeine (FIORICET) 50-325-40 MG tablet   Oral   Take 1-2 tablets by mouth every 6 (six) hours as needed for headache. Patient not taking: Reported on 06/21/2015   20 tablet   0   . Doxylamine-Pyridoxine (DICLEGIS) 10-10 MG TBEC   Oral   Take 2 tablets by mouth at bedtime. If symptoms persist, add one tablet in the morning and one in the afternoon   100 tablet   5   . fluticasone (FLONASE) 50 MCG/ACT nasal spray   Each Nare   Place 1 spray into both nostrils 2 (two) times daily.   16 g   0   . Prenatal MV-Min-Fe Fum-FA-DHA (PRENATAL 1 PO)   Oral   Take 1 tablet by mouth daily.         . promethazine (PHENERGAN)  25 MG tablet   Oral   Take 1 tablet (25 mg total) by mouth every 6 (six) hours as needed for nausea or vomiting.   30 tablet   2   . topiramate (TOPAMAX) 100 MG tablet   Oral   Take 100 mg by mouth. Reported on 07/19/2015           Allergies Phenytoin  Family History  Problem Relation Age of Onset  . Breast cancer Maternal Grandmother   . Diabetes Mother   . Seizures Cousin     maternal  . Stroke Cousin     maternal  . Autism Paternal Grandfather     Social History Social History  Substance Use Topics  . Smoking status: Never Smoker   . Smokeless tobacco: Never Used  . Alcohol Use: No     Review of Systems  Constitutional: No fever/chills Eyes: No visual changes. No discharge ENT: Positive sore throat. Positive nasal congestion. Cardiovascular: no chest pain. Respiratory: Positive for cough. No SOB. Gastrointestinal: No abdominal pain.  No nausea, no vomiting.  No diarrhea.  No constipation. Genitourinary: Negative for dysuria. No hematuria Musculoskeletal: Negative for back pain. Skin: Negative for rash. Neurological: Negative for headaches, focal weakness or numbness. 10-point ROS otherwise negative.  ____________________________________________   PHYSICAL EXAM:  VITAL SIGNS: ED Triage Vitals  Enc Vitals Group     BP 08/01/15 1149 121/91 mmHg     Pulse Rate 08/01/15 1149 100  Resp --      Temp 08/01/15 1149 98.3 F (36.8 C)     Temp src --      SpO2 08/01/15 1149 97 %     Weight 08/01/15 1149 150 lb (68.04 kg)     Height 08/01/15 1149 5\' 2"  (1.575 m)     Head Cir --      Peak Flow --      Pain Score 08/01/15 1152 0     Pain Loc --      Pain Edu? --      Excl. in GC? --      Constitutional: Alert and oriented. Well appearing and in no acute distress. Eyes: Conjunctivae are normal. PERRL. EOMI. Head: Atraumatic. ENT:      Ears: EACs and TMs are unremarkable bilaterally.      Nose: Moderate clear congestion/rhinnorhea. No tenderness to  percussion over sinuses.      Mouth/Throat: Mucous membranes are moist. Oropharynx is mildly erythematous. Tonsils are non-edematous and nonerythematous and no exudates are identified. Neck: No stridor.   Hematological/Lymphatic/Immunilogical: No cervical lymphadenopathy. Cardiovascular: Normal rate, regular rhythm. Normal S1 and S2.  Good peripheral circulation. Respiratory: Normal respiratory effort without tachypnea or retractions. Lungs CTAB. Gastrointestinal: Soft and nontender. No distention. No CVA tenderness. Musculoskeletal: No lower extremity tenderness nor edema.  No joint effusions. Neurologic:  Normal speech and language. No gross focal neurologic deficits are appreciated.  Skin:  Skin is warm, dry and intact. No rash noted. Psychiatric: Mood and affect are normal. Speech and behavior are normal. Patient exhibits appropriate insight and judgement.   ____________________________________________   LABS (all labs ordered are listed, but only abnormal results are displayed)  Labs Reviewed - No data to display ____________________________________________  EKG   ____________________________________________  RADIOLOGY   No results found.  ____________________________________________    PROCEDURES  Procedure(s) performed:       Medications - No data to display   ____________________________________________   INITIAL IMPRESSION / ASSESSMENT AND PLAN / ED COURSE  Pertinent labs & imaging results that were available during my care of the patient were reviewed by me and considered in my medical decision making (see chart for details).  Patient's diagnosis is consistent with viral upper respiratory infection. Patient will be discharged home with prescriptions for Flonase for symptom control. Patient is [redacted] weeks pregnant and will not be started on cough medication or other over-the-counter medications. She is to take Tylenol and she may use honey for additional  symptom control.. Patient is to follow up with primary care provider or OB/GYN if symptoms persist past this treatment course. Patient is given ED precautions to return to the ED for any worsening or new symptoms.     ____________________________________________  FINAL CLINICAL IMPRESSION(S) / ED DIAGNOSES  Final diagnoses:  Viral upper respiratory illness  Pregnant      NEW MEDICATIONS STARTED DURING THIS VISIT:  New Prescriptions   FLUTICASONE (FLONASE) 50 MCG/ACT NASAL SPRAY    Place 1 spray into both nostrils 2 (two) times daily.       Delorise RoyalsJonathan D Ladeja Pelham, PA-C 08/01/15 1313  Jennye MoccasinBrian S Quigley, MD 08/01/15 [phone]

## 2015-08-01 NOTE — ED Notes (Signed)
PT to ER with c/o cough, congestion, sore throat and nasal symptoms.  Pt states cough productive of yellow sputum.  Pt states she is [redacted] weeks pregnant.

## 2015-08-01 NOTE — Discharge Instructions (Signed)
Upper Respiratory Infection, Adult Most upper respiratory infections (URIs) are a viral infection of the air passages leading to the lungs. A URI affects the nose, throat, and upper air passages. The most common type of URI is nasopharyngitis and is typically referred to as "the common cold." URIs run their course and usually go away on their own. Most of the time, a URI does not require medical attention, but sometimes a bacterial infection in the upper airways can follow a viral infection. This is called a secondary infection. Sinus and middle ear infections are common types of secondary upper respiratory infections. Bacterial pneumonia can also complicate a URI. A URI can worsen asthma and chronic obstructive pulmonary disease (COPD). Sometimes, these complications can require emergency medical care and may be life threatening.  CAUSES Almost all URIs are caused by viruses. A virus is a type of germ and can spread from one person to another.  RISKS FACTORS You may be at risk for a URI if:   You smoke.   You have chronic heart or lung disease.  You have a weakened defense (immune) system.   You are very young or very old.   You have nasal allergies or asthma.  You work in crowded or poorly ventilated areas.  You work in health care facilities or schools. SIGNS AND SYMPTOMS  Symptoms typically develop 2-3 days after you come in contact with a cold virus. Most viral URIs last 7-10 days. However, viral URIs from the influenza virus (flu virus) can last 14-18 days and are typically more severe. Symptoms may include:   Runny or stuffy (congested) nose.   Sneezing.   Cough.   Sore throat.   Headache.   Fatigue.   Fever.   Loss of appetite.   Pain in your forehead, behind your eyes, and over your cheekbones (sinus pain).  Muscle aches.  DIAGNOSIS  Your health care provider may diagnose a URI by:  Physical exam.  Tests to check that your symptoms are not due to  another condition such as:  Strep throat.  Sinusitis.  Pneumonia.  Asthma. TREATMENT  A URI goes away on its own with time. It cannot be cured with medicines, but medicines may be prescribed or recommended to relieve symptoms. Medicines may help:  Reduce your fever.  Reduce your cough.  Relieve nasal congestion. HOME CARE INSTRUCTIONS   Take medicines only as directed by your health care provider.   Gargle warm saltwater or take cough drops to comfort your throat as directed by your health care provider.  Use a warm mist humidifier or inhale steam from a shower to increase air moisture. This may make it easier to breathe.  Drink enough fluid to keep your urine clear or pale yellow.   Eat soups and other clear broths and maintain good nutrition.   Rest as needed.   Return to work when your temperature has returned to normal or as your health care provider advises. You may need to stay home longer to avoid infecting others. You can also use a face mask and careful hand washing to prevent spread of the virus.  Increase the usage of your inhaler if you have asthma.   Do not use any tobacco products, including cigarettes, chewing tobacco, or electronic cigarettes. If you need help quitting, ask your health care provider. PREVENTION  The best way to protect yourself from getting a cold is to practice good hygiene.   Avoid oral or hand contact with people with cold   symptoms.   Wash your hands often if contact occurs.  There is no clear evidence that vitamin C, vitamin E, echinacea, or exercise reduces the chance of developing a cold. However, it is always recommended to get plenty of rest, exercise, and practice good nutrition.  SEEK MEDICAL CARE IF:   You are getting worse rather than better.   Your symptoms are not controlled by medicine.   You have chills.  You have worsening shortness of breath.  You have brown or red mucus.  You have yellow or brown nasal  discharge.  You have pain in your face, especially when you bend forward.  You have a fever.  You have swollen neck glands.  You have pain while swallowing.  You have white areas in the back of your throat. SEEK IMMEDIATE MEDICAL CARE IF:   You have severe or persistent:  Headache.  Ear pain.  Sinus pain.  Chest pain.  You have chronic lung disease and any of the following:  Wheezing.  Prolonged cough.  Coughing up blood.  A change in your usual mucus.  You have a stiff neck.  You have changes in your:  Vision.  Hearing.  Thinking.  Mood. MAKE SURE YOU:   Understand these instructions.  Will watch your condition.  Will get help right away if you are not doing well or get worse.   This information is not intended to replace advice given to you by your health care provider. Make sure you discuss any questions you have with your health care provider.   Document Released: 12/26/2000 Document Revised: 11/16/2014 Document Reviewed: 10/07/2013 Elsevier Interactive Patient Education 2016 [employer]  

## 2015-08-04 ENCOUNTER — Telehealth: Payer: Self-pay | Admitting: Obstetrics and Gynecology

## 2015-08-04 NOTE — Telephone Encounter (Signed)
Pt called and she requesting diclegis but we have no more samples and medicaid has not approved it, so she wanted to see if phenergan could be called in since we have no diclegis, she uses [facility] in [place].

## 2015-08-04 NOTE — Telephone Encounter (Signed)
Notified pt rx was @ pharmacy

## 2015-08-17 ENCOUNTER — Encounter: Payer: Medicaid Other | Admitting: Obstetrics and Gynecology

## 2015-08-17 ENCOUNTER — Ambulatory Visit (INDEPENDENT_AMBULATORY_CARE_PROVIDER_SITE_OTHER): Payer: Medicaid Other | Admitting: Obstetrics and Gynecology

## 2015-08-17 ENCOUNTER — Ambulatory Visit (INDEPENDENT_AMBULATORY_CARE_PROVIDER_SITE_OTHER): Payer: Medicaid Other

## 2015-08-17 ENCOUNTER — Encounter: Payer: Self-pay | Admitting: Obstetrics and Gynecology

## 2015-08-17 VITALS — BP 121/85 | HR 91 | Wt 152.2 lb

## 2015-08-17 DIAGNOSIS — Z331 Pregnant state, incidental: Secondary | ICD-10-CM

## 2015-08-17 DIAGNOSIS — O350XX0 Maternal care for (suspected) central nervous system malformation in fetus, not applicable or unspecified: Secondary | ICD-10-CM | POA: Insufficient documentation

## 2015-08-17 DIAGNOSIS — O283 Abnormal ultrasonic finding on antenatal screening of mother: Secondary | ICD-10-CM

## 2015-08-17 DIAGNOSIS — IMO0002 Reserved for concepts with insufficient information to code with codable children: Secondary | ICD-10-CM

## 2015-08-17 LAB — POCT URINALYSIS DIPSTICK
BILIRUBIN UA: NEGATIVE
Blood, UA: NEGATIVE
Glucose, UA: NEGATIVE
KETONES UA: NEGATIVE
Nitrite, UA: NEGATIVE
PH UA: 6
SPEC GRAV UA: 1.02
Urobilinogen, UA: 0.2

## 2015-08-17 NOTE — Progress Notes (Signed)
ROB- pt is doing well, denies any complaints 

## 2015-08-17 NOTE — Patient Instructions (Signed)
The presence of isolated choroid plexus cysts (CPCs) on a second trimester ultrasound is a common cause of anxiety, although it is almost always an innocent finding. Here are the answers to some commonly asked questions about CPCs. What are choroid plexus cysts? The choroid plexus is the part of the brain that makes cerebrospinal fluid, the fluid that normally bathes and protects the brain and spinal column. In about 1 to 2 percent of normal babies - 1 out of 50 to 100 - a tiny bubble of fluid is pinched off as the choroid plexus forms. This appears as a cyst inside the choroid plexus at the time of ultrasound. A choroid plexus cyst can be likened to a blister and is not considered a brain abnormality. What is going to happen to the cyst? In the vast majority of cases, the cyst resolves or disappears and has no consequences. What is the concern? As mentioned, choroid plexus cysts are present in 1 to 2 percent of normal fetuses. However, in a very small percentage of fetuses with choroid plexus cysts, there is an associated chromosome disorder called trisomy 47. Fetuses with trisomy 18 have an extra copy of chromosome 18. Frequently, fetuses with trisomy 37 are stillborn. Survivors beyond infancy are rare. They have severe mental retardation and a variety of other problems including abnormalities of almost any organ system such as the heart, brain and kidneys. Fetuses with trisomy 18 have choroid plexus cysts about a third of the time. Therefore, when we see choroid plexus cysts, we are concerned that the fetus may have trisomy 18. Trisomy 18 is rare. It is present in less than 1 in 3,000 newborns. Choroid plexus cysts are relatively common in normal fetuses. Most fetuses with a choroid plexus cyst are normal. Furthermore, many of the abnormalities associated with trisomy 18 can be detected by a careful ultrasound. In fact, fetuses with trisomy 18 almost always demonstrate abnormalities on ultrasound in  addition to choroid plexus cysts, although some of these abnormalities can be quite subtle. If no additional abnormalities are detected by a thorough "level II" ultrasound, the likelihood the fetus has trisomy 18 is very low. What are the odds that it is a sign of trisomy 26? The precise rate of risk is difficult to estimate and is somewhat controversial, but most doctors believe it is well under 1 percent. In other words, a fetus with choroid plexus cysts but an otherwise normal ultrasound has a better than 99 percent chance of not having trisomy 18. A normal alpha fetoprotein (AFP) test further reduces the likelihood of trisomy 18. Does the size of the cyst matter? Ordinarily, the size does not matter, although multiple, large cysts are slightly more worrisome. What is the next step after a cyst is discovered? We can perform an amniocentesis, and count the number of chromosomes in the fetus to be certain there is not an extra copy of chromosome 18. At the same time, we can rule out other chromosomal abnormalities, such as Down syndrome, although we have no reason to suspect any of these in your case. However there is a small risk of amniocentesis causing a miscarriage. How high is the miscarriage risk with amniocentesis? The rate of risk for miscarriage with amniocentesis is difficult to determine. Many doctors cite 0.5 percent or 1 in 200. Can I just wait to see if the cyst resolves? That won't help your decision, because cysts almost always resolve in both normal fetuses, as well at those with trisomy 18. The  natural course for a CPC is to resolve or disappear. However, even when the cyst resolves, it does not reduce the chance that the fetus has trisomy 82. What should I do? If you do nothing further, you should be confident that your baby most likely has normal chromosomes. If you have not yet had an alpha fetoprotein (AFP) test, this can be done to further evaluate the chance of trisomy 18. A  level II ultrasound, or a targeted scan, should be done, if it has not been done already to look for other signs of trisomy 18. If you need to be certain, we can do the amniocentesis, with the knowledge that it is very unlikely to result in a complication. Your decision should depend on what you would do with the information from an amniocentesis, how you would feel about a miscarriage, how you would feel about an affected child and the anxiety you might feel until the child is born. If you want to proceed with the amniocentesis, you can be reassured that the complications from the procedure are quite rare.

## 2015-08-17 NOTE — Progress Notes (Signed)
Indications: Anatomy Findings:  Singleton intrauterine pregnancy is visualized with FHR at 147 BPM. Biometrics give an (U/S) Gestational age of [redacted] weeks 2 days,  and an (U/S) EDD of 01/09/16; this correlates with the clinically established EDD of 01/06/16.  Fetal presentation is vertex, spine left lateral.  EFW: 279 grams ( 0 lbs. 10 oz. ). Placenta: Anterior, grade 0 and remote to cervix by 6.8 cm.  AFI: Adequate with a MVP of 5.4 cm.   Anatomic survey is complete. There is a 3.5 x 2.8 mm right choroid plexus cyst, otherwise, all other anatomy appears WNL. Gender - Mom does not want to know at this time.   Right Ovary measures 3.7 x 2.3 x 2.2 cm. And appears WNL. Left Ovary measures 3.2 x 2.5 x 1.9 cm and appears WNL. There is no evidence of a corpus luteal cyst. Survey of the adnexa demonstrates no adnexal masses. There is no free peritoneal fluid in the cul de sac.  Impression: 1. 19 week 2 day Viable Singleton Intrauterine pregnancy by U/S. 2. (U/S) EDD is consistent with Clinically established (LMP) EDD of 01/06/16. 3. There is a 3.5 x 2.8 mm right choroid plexus cyst seen, otherwise, all other anatomy appears WNL.

## 2015-09-14 ENCOUNTER — Ambulatory Visit (INDEPENDENT_AMBULATORY_CARE_PROVIDER_SITE_OTHER): Payer: Medicaid Other | Admitting: Obstetrics and Gynecology

## 2015-09-14 ENCOUNTER — Encounter: Payer: Self-pay | Admitting: Obstetrics and Gynecology

## 2015-09-14 VITALS — BP 116/72 | HR 108 | Wt 157.5 lb

## 2015-09-14 DIAGNOSIS — Z331 Pregnant state, incidental: Secondary | ICD-10-CM

## 2015-09-14 LAB — POCT URINALYSIS DIPSTICK
BILIRUBIN UA: NEGATIVE
GLUCOSE UA: NEGATIVE
Ketones, UA: NEGATIVE
Nitrite, UA: NEGATIVE
RBC UA: NEGATIVE
SPEC GRAV UA: 1.01
Urobilinogen, UA: 0.2
pH, UA: 6

## 2015-09-14 NOTE — Progress Notes (Signed)
ROB doing well, f/u scan next visit

## 2015-09-14 NOTE — Progress Notes (Signed)
ROB-pt denies any complaints 

## 2015-10-10 ENCOUNTER — Other Ambulatory Visit: Payer: Self-pay | Admitting: *Deleted

## 2015-10-10 DIAGNOSIS — Z131 Encounter for screening for diabetes mellitus: Secondary | ICD-10-CM

## 2015-10-10 DIAGNOSIS — Z331 Pregnant state, incidental: Secondary | ICD-10-CM

## 2015-10-13 ENCOUNTER — Encounter: Payer: Self-pay | Admitting: Obstetrics and Gynecology

## 2015-10-13 ENCOUNTER — Ambulatory Visit (INDEPENDENT_AMBULATORY_CARE_PROVIDER_SITE_OTHER): Payer: Medicaid Other | Admitting: Obstetrics and Gynecology

## 2015-10-13 ENCOUNTER — Ambulatory Visit (INDEPENDENT_AMBULATORY_CARE_PROVIDER_SITE_OTHER): Payer: Medicaid Other

## 2015-10-13 VITALS — BP 121/75 | HR 103 | Wt 163.4 lb

## 2015-10-13 DIAGNOSIS — Z131 Encounter for screening for diabetes mellitus: Secondary | ICD-10-CM

## 2015-10-13 DIAGNOSIS — Z331 Pregnant state, incidental: Secondary | ICD-10-CM

## 2015-10-13 DIAGNOSIS — Z23 Encounter for immunization: Secondary | ICD-10-CM

## 2015-10-13 LAB — POCT URINALYSIS DIPSTICK
BILIRUBIN UA: NEGATIVE
Glucose, UA: NEGATIVE
KETONES UA: NEGATIVE
NITRITE UA: NEGATIVE
PH UA: 6
Protein, UA: NEGATIVE
RBC UA: NEGATIVE
Spec Grav, UA: 1.02
Urobilinogen, UA: 0.2

## 2015-10-13 MED ORDER — TETANUS-DIPHTH-ACELL PERTUSSIS 5-2.5-18.5 LF-MCG/0.5 IM SUSP
0.5000 mL | Freq: Once | INTRAMUSCULAR | Status: AC
  Administered 2015-10-13: 0.5 mL via INTRAMUSCULAR

## 2015-10-13 NOTE — Progress Notes (Signed)
ROB- glucola done,blood consent signed,tdap given, pain under L breast x several months

## 2015-10-13 NOTE — Progress Notes (Signed)
Indications:F/U Anatomy Findings:  Mason JimSingleton intrauterine pregnancy is visualized with FHR at 145 BPM.   Fetal presentation is Vertex. . Placenta: Anterior, Grade 1, remote to cervix. AFI: 19cm.  Rt choroid plexus cyst seen and appears stable.   Survey of the adnexa demonstrates no adnexal masses. There is no free peritoneal fluid in the cul de sac.  Impression: 1.Right Choroid Plexus Cyst

## 2015-10-14 LAB — HEMOGLOBIN AND HEMATOCRIT, BLOOD
HEMATOCRIT: 33.8 % — AB (ref 34.0–46.6)
Hemoglobin: 11.6 g/dL (ref 11.1–15.9)

## 2015-10-14 LAB — GLUCOSE, 1 HOUR GESTATIONAL: GESTATIONAL DIABETES SCREEN: 79 mg/dL (ref 65–139)

## 2015-10-26 ENCOUNTER — Ambulatory Visit (INDEPENDENT_AMBULATORY_CARE_PROVIDER_SITE_OTHER): Payer: Medicaid Other | Admitting: Obstetrics and Gynecology

## 2015-10-26 ENCOUNTER — Encounter: Payer: Self-pay | Admitting: Obstetrics and Gynecology

## 2015-10-26 VITALS — BP 126/72 | HR 116 | Wt 164.7 lb

## 2015-10-26 DIAGNOSIS — Z331 Pregnant state, incidental: Secondary | ICD-10-CM

## 2015-10-26 LAB — POCT URINALYSIS DIPSTICK
Bilirubin, UA: NEGATIVE
Blood, UA: NEGATIVE
Glucose, UA: NEGATIVE
Ketones, UA: NEGATIVE
Nitrite, UA: NEGATIVE
Spec Grav, UA: 1.015
Urobilinogen, UA: 0.2
pH, UA: 7

## 2015-10-26 NOTE — Progress Notes (Signed)
ROB-pt denies any complaints 

## 2015-10-26 NOTE — Progress Notes (Signed)
ROB- doing well, needs refill on phenergan, plans epidural in labor,unsure about birth control

## 2015-11-04 ENCOUNTER — Other Ambulatory Visit: Payer: Self-pay | Admitting: Obstetrics and Gynecology

## 2015-11-04 DIAGNOSIS — O3680X0 Pregnancy with inconclusive fetal viability, not applicable or unspecified: Secondary | ICD-10-CM

## 2015-11-09 ENCOUNTER — Other Ambulatory Visit: Payer: Self-pay | Admitting: Obstetrics and Gynecology

## 2015-11-09 DIAGNOSIS — IMO0001 Reserved for inherently not codable concepts without codable children: Secondary | ICD-10-CM

## 2015-11-10 ENCOUNTER — Ambulatory Visit (INDEPENDENT_AMBULATORY_CARE_PROVIDER_SITE_OTHER): Payer: Medicaid Other

## 2015-11-10 DIAGNOSIS — IMO0001 Reserved for inherently not codable concepts without codable children: Secondary | ICD-10-CM

## 2015-11-10 DIAGNOSIS — O350XX0 Maternal care for (suspected) central nervous system malformation in fetus, not applicable or unspecified: Secondary | ICD-10-CM | POA: Diagnosis not present

## 2015-11-24 ENCOUNTER — Encounter: Payer: Self-pay | Admitting: Obstetrics and Gynecology

## 2015-11-24 ENCOUNTER — Encounter: Payer: Medicaid Other | Admitting: Obstetrics and Gynecology

## 2015-11-24 ENCOUNTER — Ambulatory Visit (INDEPENDENT_AMBULATORY_CARE_PROVIDER_SITE_OTHER): Payer: Medicaid Other | Admitting: Obstetrics and Gynecology

## 2015-11-24 VITALS — BP 115/75 | HR 109 | Wt 171.6 lb

## 2015-11-24 DIAGNOSIS — Z331 Pregnant state, incidental: Secondary | ICD-10-CM

## 2015-11-24 LAB — POCT URINALYSIS DIPSTICK
Bilirubin, UA: NEGATIVE
GLUCOSE UA: NEGATIVE
Ketones, UA: NEGATIVE
NITRITE UA: NEGATIVE
RBC UA: NEGATIVE
Spec Grav, UA: 1.01
UROBILINOGEN UA: 0.2
pH, UA: 6.5

## 2015-11-24 NOTE — Progress Notes (Signed)
ROB- doing well, FOB as only support person in labor room;

## 2015-11-24 NOTE — Progress Notes (Signed)
ROB- pt is doing well 

## 2015-12-08 ENCOUNTER — Ambulatory Visit (INDEPENDENT_AMBULATORY_CARE_PROVIDER_SITE_OTHER): Payer: Medicaid Other | Admitting: Obstetrics and Gynecology

## 2015-12-08 ENCOUNTER — Encounter: Payer: Self-pay | Admitting: Obstetrics and Gynecology

## 2015-12-08 VITALS — BP 111/72 | HR 115 | Wt 175.1 lb

## 2015-12-08 DIAGNOSIS — Z36 Encounter for antenatal screening of mother: Secondary | ICD-10-CM

## 2015-12-08 DIAGNOSIS — Z3493 Encounter for supervision of normal pregnancy, unspecified, third trimester: Secondary | ICD-10-CM

## 2015-12-08 DIAGNOSIS — Z113 Encounter for screening for infections with a predominantly sexual mode of transmission: Secondary | ICD-10-CM

## 2015-12-08 DIAGNOSIS — R809 Proteinuria, unspecified: Secondary | ICD-10-CM

## 2015-12-08 DIAGNOSIS — Z3685 Encounter for antenatal screening for Streptococcus B: Secondary | ICD-10-CM

## 2015-12-08 LAB — POCT URINALYSIS DIPSTICK
BILIRUBIN UA: NEGATIVE
Glucose, UA: NEGATIVE
KETONES UA: NEGATIVE
Nitrite, UA: NEGATIVE
PROTEIN UA: 1
Spec Grav, UA: 1.01
Urobilinogen, UA: 0.2
pH, UA: 7.5

## 2015-12-08 NOTE — Progress Notes (Signed)
ROB- 36 week labs done. Urine shows 1+ protein; no preeclampsia symptoms or signs; urine culture is sent.

## 2015-12-08 NOTE — Addendum Note (Signed)
Addended by: Marchelle FolksMILLER, Marquel Spoto G on: 12/08/2015 02:56 PM   Modules accepted: Orders

## 2015-12-09 LAB — URINE CULTURE: Organism ID, Bacteria: NO GROWTH

## 2015-12-10 LAB — STREP GP B NAA: Strep Gp B NAA: NEGATIVE

## 2015-12-14 LAB — GC/CHLAMYDIA PROBE AMP
Chlamydia trachomatis, NAA: NEGATIVE
Neisseria gonorrhoeae by PCR: NEGATIVE

## 2015-12-16 ENCOUNTER — Encounter: Payer: Self-pay | Admitting: Obstetrics and Gynecology

## 2015-12-16 ENCOUNTER — Ambulatory Visit (INDEPENDENT_AMBULATORY_CARE_PROVIDER_SITE_OTHER): Payer: Medicaid Other | Admitting: Obstetrics and Gynecology

## 2015-12-16 VITALS — BP 118/78 | HR 103 | Wt 174.2 lb

## 2015-12-16 DIAGNOSIS — Z3493 Encounter for supervision of normal pregnancy, unspecified, third trimester: Secondary | ICD-10-CM

## 2015-12-16 LAB — POCT URINALYSIS DIPSTICK
Bilirubin, UA: NEGATIVE
Blood, UA: NEGATIVE
GLUCOSE UA: NEGATIVE
Ketones, UA: NEGATIVE
Leukocytes, UA: NEGATIVE
Nitrite, UA: NEGATIVE
Protein, UA: NEGATIVE
SPEC GRAV UA: 1.01
UROBILINOGEN UA: 0.2
pH, UA: 7

## 2015-12-16 NOTE — Progress Notes (Signed)
ROB- doing well, declined pelvic exam

## 2015-12-16 NOTE — Progress Notes (Signed)
ROB- pt is having some pelvic pressure, contractions 

## 2015-12-22 ENCOUNTER — Ambulatory Visit (INDEPENDENT_AMBULATORY_CARE_PROVIDER_SITE_OTHER): Payer: Medicaid Other | Admitting: Obstetrics and Gynecology

## 2015-12-22 ENCOUNTER — Encounter: Payer: Self-pay | Admitting: Obstetrics and Gynecology

## 2015-12-22 VITALS — BP 113/70 | HR 97 | Wt 177.2 lb

## 2015-12-22 DIAGNOSIS — Z369 Encounter for antenatal screening, unspecified: Secondary | ICD-10-CM

## 2015-12-22 DIAGNOSIS — Z36 Encounter for antenatal screening of mother: Secondary | ICD-10-CM

## 2015-12-22 DIAGNOSIS — Z1389 Encounter for screening for other disorder: Secondary | ICD-10-CM

## 2015-12-22 LAB — POCT URINALYSIS DIPSTICK
Bilirubin, UA: NEGATIVE
Glucose, UA: NEGATIVE
KETONES UA: NEGATIVE
Nitrite, UA: NEGATIVE
PROTEIN UA: NEGATIVE
RBC UA: NEGATIVE
SPEC GRAV UA: 1.01
UROBILINOGEN UA: NEGATIVE
pH, UA: 6.5

## 2015-12-22 NOTE — Progress Notes (Signed)
ROB- doing well, labor precautions discussed- request elective IOL at term if not delivered scheduled for 6/21

## 2015-12-22 NOTE — H&P (Signed)
Kathleen Mcgrath is a 20 y.o. female presenting for Elective IOL at term with advanced cervical dilation. History OB History    Gravida Para Term Preterm AB TAB SAB Ectopic Multiple Living   '2 1 1       1     ' Past Medical History  Diagnosis Date  . Seizures ([place])    History reviewed. No pertinent past surgical history. Family History: family history includes Autism in her paternal grandfather; Breast cancer in her maternal grandmother; Diabetes in her mother; Seizures in her cousin; Stroke in her cousin. Social History:  reports that she has never smoked. She has never used smokeless tobacco. She reports that she does not drink alcohol or use illicit drugs.   Prenatal Transfer Tool  Maternal Diabetes: No Genetic Screening: Normal Maternal Ultrasounds/Referrals: Normal except dilated bilateral renal pelvisis Fetal Ultrasounds or other Referrals:  Referred to Materal Fetal Medicine  Maternal Substance Abuse:  No Significant Maternal Medications:  None Significant Maternal Lab Results:  None Other Comments:  None  ROS  Dilation: 3 Effacement (%): 80 Station: -1 Blood pressure 113/70, pulse 97, weight 177 lb 3 oz (80.372 kg), last menstrual period 04/01/2015. Exam Physical Exam  A&O x4  well groomed female in no distress  abdomen gravid and no-tender HRR Lungs clear bilaterally  no pedal edema.   Prenatal labs: ABO, Rh: A/Positive/-- (11/01 1537) Antibody: Negative (11/01 1537) Rubella: <20.0 (11/01 1537) RPR: Non Reactive (11/01 1537)  HBsAg: Negative (11/01 1537)  HIV: Non Reactive (11/01 1537)  GBS: Negative (05/25 1437)   Assessment/Plan: Elective IOL at term   Vihaan Gloss N Ibraham Levi 12/22/2015, 2:49 PM

## 2015-12-25 ENCOUNTER — Inpatient Hospital Stay
Admission: EM | Admit: 2015-12-25 | Discharge: 2015-12-28 | DRG: 775 | Disposition: A | Discharge status: Home / Self Care | Payer: Medicaid Other | Attending: Obstetrics and Gynecology | Admitting: Obstetrics and Gynecology

## 2015-12-25 DIAGNOSIS — O350XX0 Maternal care for (suspected) central nervous system malformation in fetus, not applicable or unspecified: Secondary | ICD-10-CM

## 2015-12-25 DIAGNOSIS — Z3A38 38 weeks gestation of pregnancy: Secondary | ICD-10-CM

## 2015-12-25 DIAGNOSIS — Z3490 Encounter for supervision of normal pregnancy, unspecified, unspecified trimester: Secondary | ICD-10-CM

## 2015-12-25 DIAGNOSIS — IMO0002 Reserved for concepts with insufficient information to code with codable children: Secondary | ICD-10-CM

## 2015-12-26 ENCOUNTER — Inpatient Hospital Stay: Payer: Medicaid Other | Admitting: Anesthesiology

## 2015-12-26 ENCOUNTER — Encounter: Payer: Self-pay | Admitting: *Deleted

## 2015-12-26 DIAGNOSIS — Z3493 Encounter for supervision of normal pregnancy, unspecified, third trimester: Secondary | ICD-10-CM | POA: Diagnosis present

## 2015-12-26 DIAGNOSIS — Z3483 Encounter for supervision of other normal pregnancy, third trimester: Secondary | ICD-10-CM

## 2015-12-26 DIAGNOSIS — Z3490 Encounter for supervision of normal pregnancy, unspecified, unspecified trimester: Secondary | ICD-10-CM

## 2015-12-26 DIAGNOSIS — Z3A38 38 weeks gestation of pregnancy: Secondary | ICD-10-CM | POA: Diagnosis not present

## 2015-12-26 LAB — CHLAMYDIA/NGC RT PCR (ARMC ONLY)
CHLAMYDIA TR: NOT DETECTED
N GONORRHOEAE: NOT DETECTED

## 2015-12-26 LAB — CBC
HEMATOCRIT: 33.5 % — AB (ref 35.0–47.0)
HEMOGLOBIN: 11.1 g/dL — AB (ref 12.0–16.0)
MCH: 26 pg (ref 26.0–34.0)
MCHC: 33.2 g/dL (ref 32.0–36.0)
MCV: 78.2 fL — ABNORMAL LOW (ref 80.0–100.0)
Platelets: 200 10*3/uL (ref 150–440)
RBC: 4.29 MIL/uL (ref 3.80–5.20)
RDW: 14.3 % (ref 11.5–14.5)
WBC: 12.5 10*3/uL — ABNORMAL HIGH (ref 3.6–11.0)

## 2015-12-26 LAB — TYPE AND SCREEN
ABO/RH(D): A POS
ANTIBODY SCREEN: NEGATIVE

## 2015-12-26 MED ORDER — SODIUM CHLORIDE FLUSH 0.9 % IV SOLN
INTRAVENOUS | Status: AC
  Administered 2015-12-26: 10 mL
  Filled 2015-12-26: qty 10

## 2015-12-26 MED ORDER — OXYTOCIN 40 UNITS IN LACTATED RINGERS INFUSION - SIMPLE MED
INTRAVENOUS | Status: AC
  Filled 2015-12-26: qty 1000

## 2015-12-26 MED ORDER — LIDOCAINE HCL (PF) 1 % IJ SOLN: 30.0000 mL | INTRAMUSCULAR | Status: DC | PRN

## 2015-12-26 MED ORDER — WITCH HAZEL-GLYCERIN EX PADS: 1.0000 "application " | MEDICATED_PAD | CUTANEOUS | Status: DC | PRN

## 2015-12-26 MED ORDER — SIMETHICONE 80 MG PO CHEW: 80.0000 mg | CHEWABLE_TABLET | ORAL | Status: DC | PRN

## 2015-12-26 MED ORDER — SODIUM CHLORIDE FLUSH 0.9 % IV SOLN
INTRAVENOUS | Status: AC
  Filled 2015-12-26: qty 10

## 2015-12-26 MED ORDER — OXYTOCIN 40 UNITS IN LACTATED RINGERS INFUSION - SIMPLE MED
1.0000 m[IU]/min | INTRAVENOUS | Status: DC
  Administered 2015-12-26: 2 m[IU]/min via INTRAVENOUS

## 2015-12-26 MED ORDER — LIDOCAINE-EPINEPHRINE (PF) 1.5 %-1:200000 IJ SOLN
INTRAMUSCULAR | Status: DC | PRN
  Administered 2015-12-26: 3 mL via EPIDURAL

## 2015-12-26 MED ORDER — TERBUTALINE SULFATE 1 MG/ML IJ SOLN: 0.2500 mg | Freq: Once | INTRAMUSCULAR | Status: DC | PRN

## 2015-12-26 MED ORDER — DIPHENHYDRAMINE HCL 25 MG PO CAPS: 25.0000 mg | ORAL_CAPSULE | Freq: Four times a day (QID) | ORAL | Status: DC | PRN

## 2015-12-26 MED ORDER — ONDANSETRON HCL 4 MG/2ML IJ SOLN
4.0000 mg | Freq: Four times a day (QID) | INTRAMUSCULAR | Status: DC | PRN
  Administered 2015-12-26: 4 mg via INTRAVENOUS
  Filled 2015-12-26: qty 2

## 2015-12-26 MED ORDER — ONDANSETRON HCL 4 MG/2ML IJ SOLN: 4.0000 mg | INTRAMUSCULAR | Status: DC | PRN

## 2015-12-26 MED ORDER — LACTATED RINGERS IV SOLN
500.0000 mL | INTRAVENOUS | Status: DC | PRN
  Administered 2015-12-26: 500 mL via INTRAVENOUS

## 2015-12-26 MED ORDER — LACTATED RINGERS IV SOLN
INTRAVENOUS | Status: DC
  Administered 2015-12-26: 11:00:00 via INTRAVENOUS

## 2015-12-26 MED ORDER — ZOLPIDEM TARTRATE 5 MG PO TABS: 5.0000 mg | ORAL_TABLET | Freq: Every evening | ORAL | Status: DC | PRN

## 2015-12-26 MED ORDER — ACETAMINOPHEN 325 MG PO TABS: 650.0000 mg | ORAL_TABLET | ORAL | Status: DC | PRN

## 2015-12-26 MED ORDER — BENZOCAINE-MENTHOL 20-0.5 % EX AERO: 1.0000 "application " | INHALATION_SPRAY | CUTANEOUS | Status: DC | PRN

## 2015-12-26 MED ORDER — FENTANYL 2.5 MCG/ML W/ROPIVACAINE 0.2% IN NS 100 ML EPIDURAL INFUSION (ARMC-ANES)
EPIDURAL | Status: DC | PRN
  Administered 2015-12-26: 10 mL/h via EPIDURAL

## 2015-12-26 MED ORDER — ONDANSETRON HCL 4 MG PO TABS: 4.0000 mg | ORAL_TABLET | ORAL | Status: DC | PRN

## 2015-12-26 MED ORDER — LIDOCAINE HCL (PF) 1 % IJ SOLN
INTRAMUSCULAR | Status: DC | PRN
  Administered 2015-12-26: 3 mL via SUBCUTANEOUS

## 2015-12-26 MED ORDER — COCONUT OIL OIL: 1.0000 "application " | TOPICAL_OIL | Status: DC | PRN

## 2015-12-26 MED ORDER — OXYTOCIN 40 UNITS IN LACTATED RINGERS INFUSION - SIMPLE MED
INTRAVENOUS | Status: AC
  Administered 2015-12-26: 2 m[IU]/min via INTRAVENOUS
  Filled 2015-12-26: qty 1000

## 2015-12-26 MED ORDER — MEASLES, MUMPS & RUBELLA VAC ~~LOC~~ INJ
0.5000 mL | INJECTION | Freq: Once | SUBCUTANEOUS | Status: AC
  Administered 2015-12-28: 0.5 mL via SUBCUTANEOUS
  Filled 2015-12-26 (×2): qty 0.5

## 2015-12-26 MED ORDER — BUPIVACAINE HCL (PF) 0.25 % IJ SOLN
INTRAMUSCULAR | Status: DC | PRN
  Administered 2015-12-26 (×2): 4 mL via EPIDURAL

## 2015-12-26 MED ORDER — FENTANYL 2.5 MCG/ML W/ROPIVACAINE 0.2% IN NS 100 ML EPIDURAL INFUSION (ARMC-ANES)
EPIDURAL | Status: AC
  Filled 2015-12-26: qty 100

## 2015-12-26 MED ORDER — IBUPROFEN 600 MG PO TABS
600.0000 mg | ORAL_TABLET | Freq: Four times a day (QID) | ORAL | Status: DC
  Administered 2015-12-26 – 2015-12-28 (×6): 600 mg via ORAL
  Filled 2015-12-26 (×6): qty 1

## 2015-12-26 MED ORDER — AMMONIA AROMATIC IN INHA
RESPIRATORY_TRACT | Status: DC
Start: 2015-12-26 — End: 2015-12-26
  Filled 2015-12-26: qty 10

## 2015-12-26 MED ORDER — SODIUM CHLORIDE 0.9 % IV SOLN: 250.0000 mL | INTRAVENOUS | Status: DC | PRN

## 2015-12-26 MED ORDER — DIBUCAINE 1 % RE OINT: 1.0000 "application " | TOPICAL_OINTMENT | RECTAL | Status: DC | PRN

## 2015-12-26 MED ORDER — PRENATAL MULTIVITAMIN CH
1.0000 | ORAL_TABLET | Freq: Every day | ORAL | Status: DC
  Administered 2015-12-27 – 2015-12-28 (×2): 1 via ORAL
  Filled 2015-12-26 (×2): qty 1

## 2015-12-26 MED ORDER — LIDOCAINE HCL (PF) 1 % IJ SOLN
INTRAMUSCULAR | Status: AC
  Filled 2015-12-26: qty 30

## 2015-12-26 MED ORDER — SENNOSIDES-DOCUSATE SODIUM 8.6-50 MG PO TABS
2.0000 | ORAL_TABLET | ORAL | Status: DC
  Administered 2015-12-27: 2 via ORAL
  Filled 2015-12-26 (×2): qty 2

## 2015-12-26 MED ORDER — MISOPROSTOL 200 MCG PO TABS
ORAL_TABLET | ORAL | Status: AC
  Filled 2015-12-26: qty 4

## 2015-12-26 MED ORDER — OXYTOCIN 10 UNIT/ML IJ SOLN
INTRAMUSCULAR | Status: AC
  Filled 2015-12-26: qty 2

## 2015-12-26 NOTE — Progress Notes (Signed)
Kathleen Mcgrath is a 20 y.o. G2P1001 at 6761w3d by LMP admitted for active labor  Subjective: Reports mild pain in her back with contractions  Objective: BP 123/64 mmHg  Pulse 92  Temp(Src) 98.7 F (37.1 C) (Oral)  Resp 18  Ht 5\' 2"  (1.575 m)  Wt 177 lb (80.287 kg)  BMI 32.37 kg/m2  LMP 04/01/2015 (Approximate)   Total I/O In: 40 [P.O.:30; I.V.:10] Out: -   FHT:  FHR: 144 bpm, variability: moderate,  accelerations:  Present,  decelerations:  Absent UC:   irregular, every 2-5 minutes, moderate to palpation SVE:   Dilation: 8 Effacement (%): 80 Station: -1 Exam by:: Crystian Frith, CNM; AROM- moderate amount clear fluid Labs: Lab Results  Component Value Date   WBC 12.5* 12/26/2015   HGB 11.1* 12/26/2015   HCT 33.5* 12/26/2015   MCV 78.2* 12/26/2015   PLT 200 12/26/2015    Assessment / Plan: Spontaneous labor, progressing normally  Labor: Progressing normally Preeclampsia:  labs stable Fetal Wellbeing:  Category I Pain Control:  Labor support without medications I/D:  n/a Anticipated MOD:  NSVD  Ridwan Bondy N Zayed Griffie 12/26/2015, 9:33 AM

## 2015-12-26 NOTE — Anesthesia Preprocedure Evaluation (Signed)
Anesthesia Evaluation  Patient identified by MRN, date of birth, ID band Patient awake    Reviewed: Allergy & Precautions, H&P , NPO status , Patient's Chart, lab work & pertinent test results  History of Anesthesia Complications Negative for: history of anesthetic complications  Airway Mallampati: II   Neck ROM: full    Dental   Pulmonary           Cardiovascular      Neuro/Psych Seizures -, Well Controlled,  Last seizure one year ago. Not on medication while pregnant.    GI/Hepatic   Endo/Other    Renal/GU      Musculoskeletal   Abdominal   Peds  Hematology   Anesthesia Other Findings   Reproductive/Obstetrics (+) Pregnancy                             Anesthesia Physical Anesthesia Plan  ASA: III  Anesthesia Plan: Epidural   Post-op Pain Management:    Induction:   Airway Management Planned:   Additional Equipment:   Intra-op Plan:   Post-operative Plan:   Informed Consent: I have reviewed the patients History and Physical, chart, labs and discussed the procedure including the risks, benefits and alternatives for the proposed anesthesia with the patient or authorized representative who has indicated his/her understanding and acceptance.     Plan Discussed with: Anesthesiologist  Anesthesia Plan Comments:         Anesthesia Quick Evaluation

## 2015-12-26 NOTE — Progress Notes (Signed)
Kathleen RacerJenna N Mcgrath is a 20 y.o. G2P1001 at 2435w3d by LMP admitted for active labor  Subjective: Breathing through contractions  Objective: BP 123/64 mmHg  Pulse 92  Temp(Src) 98.7 F (37.1 C) (Oral)  Resp 18  Ht 5\' 2"  (1.575 m)  Wt 177 lb (80.287 kg)  BMI 32.37 kg/m2  LMP 04/01/2015 (Approximate)   Total I/O In: 40 [P.O.:30; I.V.:10] Out: -   FHT:  FHR: 145 bpm, variability: moderate,  accelerations:  Present,  decelerations:  Absent UC:   irregular, every 2-4 minutes, moderate to palpation SVE:   Dilation: 5.5 Effacement (%): 80 Station: -1 Exam by:: MBS  Labs: Lab Results  Component Value Date   WBC 12.5* 12/26/2015   HGB 11.1* 12/26/2015   HCT 33.5* 12/26/2015   MCV 78.2* 12/26/2015   PLT 200 12/26/2015    Assessment / Plan: Spontaneous labor, progressing normally  Labor: Progressing normally Preeclampsia:  labs stable Fetal Wellbeing:  Category I Pain Control:  Labor support without medications I/D:  n/a Anticipated MOD:  NSVD  Bethenny Losee Suzan Nailer Jahmere Bramel, CNM 12/26/2015, 9:19 AM

## 2015-12-26 NOTE — Progress Notes (Signed)
Kathleen Mcgrath is a 20 y.o. G2P1001 at 1211w3d by LMP admitted for active labor  Subjective: Reports pain a 8 on pain scale, request epiduarl  Objective: BP 124/70 mmHg  Pulse 99  Temp(Src) 98 F (36.7 C) (Oral)  Resp 18  Ht 5\' 2"  (1.575 m)  Wt 177 lb (80.287 kg)  BMI 32.37 kg/m2  LMP 04/01/2015 (Approximate)   Total I/O In: 40 [P.O.:30; I.V.:10] Out: -   FHT:  FHR: 138 bpm, variability: moderate,  accelerations:  Present,  decelerations:  Present early with some contractions UC:   regular, every 2-3 minutes, moderate to palpation SVE:   Dilation: 9 Effacement (%): 80 Station: -1 Exam by:: Aura CampsShambley, Melody, CNM  Labs: Lab Results  Component Value Date   WBC 12.5* 12/26/2015   HGB 11.1* 12/26/2015   HCT 33.5* 12/26/2015   MCV 78.2* 12/26/2015   PLT 200 12/26/2015    Assessment / Plan: Spontaneous labor, progressing normally  Labor: Progressing normally, but will start pitocin Preeclampsia:  labs stable Fetal Wellbeing:  Category I Pain Control:  Labor support without medications, request epidural I/D:  n/a Anticipated MOD:  NSVD  Melody N Shambley 12/26/2015, 11:22 AM

## 2015-12-26 NOTE — OB Triage Note (Signed)
Recvd pt from ED. C/o mild cramping and back pain. Feeling baby move ok, no vaginal bleeding, no sexual intercourse.

## 2015-12-26 NOTE — Anesthesia Procedure Notes (Signed)
Epidural Patient location during procedure: OB Start time: 12/26/2015 11:40 AM End time: 12/26/2015 12:10 PM  Staffing Anesthesiologist: Naomie DeanKEPHART, WILLIAM K Resident/CRNA: Kyleena Scheirer Performed by: resident/CRNA   Preanesthetic Checklist Completed: patient identified, site marked, surgical consent, pre-op evaluation, IV checked, risks and benefits discussed and monitors and equipment checked  Epidural Patient position: sitting Prep: Betadine Patient monitoring: heart rate, continuous pulse ox and blood pressure Approach: midline Location: L3-L4 Injection technique: LOR air  Needle:  Needle type: Tuohy  Needle gauge: 17 G Needle length: 9 cm Needle insertion depth: 6.5 cm Catheter type: closed end flexible Catheter size: 20 Guage Catheter at skin depth: 10 cm Test dose: negative and 1.5% lidocaine with Epi 1:200 K  Assessment Events: blood not aspirated, injection not painful, no injection resistance, negative IV test and no paresthesia  Additional Notes Reason for block:procedure for pain

## 2015-12-27 LAB — CBC
HCT: 30.4 % — ABNORMAL LOW (ref 35.0–47.0)
Hemoglobin: 10.1 g/dL — ABNORMAL LOW (ref 12.0–16.0)
MCH: 26 pg (ref 26.0–34.0)
MCHC: 33.1 g/dL (ref 32.0–36.0)
MCV: 78.8 fL — ABNORMAL LOW (ref 80.0–100.0)
PLATELETS: 174 10*3/uL (ref 150–440)
RBC: 3.86 MIL/uL (ref 3.80–5.20)
RDW: 14.6 % — AB (ref 11.5–14.5)
WBC: 13.2 10*3/uL — AB (ref 3.6–11.0)

## 2015-12-27 LAB — RPR: RPR Ser Ql: NONREACTIVE

## 2015-12-27 MED ORDER — FUSION PLUS PO CAPS: 1.0000 | ORAL_CAPSULE | Freq: Every day | ORAL | Status: DC

## 2015-12-27 NOTE — Anesthesia Post-op Follow-up Note (Signed)
  Anesthesia Pain Follow-up Note  Patient: Kathleen Mcgrath  Day #: 1  Date of Follow-up: 12/27/2015 Time: 7:16 AM  Last Vitals:  Filed Vitals:   12/27/15 0023 12/27/15 0443  BP: 124/71 122/62  Pulse: 54 94  Temp: 36.9 C 36.4 C  Resp: 18 18    Level of Consciousness: alert  Pain: mild  Side Effects:None   Catheter Site Exam: site not evaluated  Plan: D/C from anesthesia care  Clydene PughBeane, Brunilda Eble D

## 2015-12-27 NOTE — Discharge Summary (Signed)
Obstetric Discharge Summary Reason for Admission: onset of labor Prenatal Procedures: none Intrapartum Procedures: spontaneous vaginal delivery Postpartum Procedures: Rubella Ig and varicella vaccine Complications-Operative and Postpartum: 1st degree perineal laceration HEMOGLOBIN  Date Value Ref Range Status  12/27/2015 10.1* 12.0 - 16.0 g/dL Final   HGB  Date Value Ref Range Status  11/27/2013 13.7 12.0-16.0 g/dL Final   HCT  Date Value Ref Range Status  12/27/2015 30.4* 35.0 - 47.0 % Final  11/27/2013 41.5 35.0-47.0 % Final   HEMATOCRIT  Date Value Ref Range Status  10/13/2015 33.8* 34.0 - 46.6 % Final    Physical Exam:  General: alert, cooperative, appears stated age and fatigued Lochia: appropriate Uterine Fundus: firm Incision: NA DVT Evaluation: No evidence of DVT seen on physical exam.  Discharge Diagnoses: Term Pregnancy-delivered  Discharge Information: Date: 12/27/2015 Activity: pelvic rest Diet: routine Medications: PNV and Iron Condition: stable Instructions: refer to practice specific booklet Discharge to: home   Newborn Data: Live born female bottle feed Birth Weight: 6 lb 11 oz (3033 g) APGAR: 8, 9  Home with mother.  Kathleen Mcgrath Kathleen Mcgrath, CNM 12/27/2015, 2:08 PM

## 2015-12-27 NOTE — Anesthesia Postprocedure Evaluation (Signed)
Anesthesia Post Note  Patient: Kathleen Mcgrath  Procedure(s) Performed: * No procedures listed *  Patient location during evaluation: Mother Baby Anesthesia Type: Epidural Level of consciousness: awake and alert and oriented Pain management: satisfactory to patient Vital Signs Assessment: post-procedure vital signs reviewed and stable Respiratory status: respiratory function stable Cardiovascular status: stable Postop Assessment: no headache, no backache, epidural receding, patient able to bend at knees, no signs of nausea or vomiting and adequate PO intake Anesthetic complications: no    Last Vitals:  Filed Vitals:   12/27/15 0023 12/27/15 0443  BP: 124/71 122/62  Pulse: 54 94  Temp: 36.9 C 36.4 C  Resp: 18 18    Last Pain:  Filed Vitals:   12/27/15 0443  PainSc: 0-No pain                 Clydene PughBeane, Bertie Simien D

## 2015-12-28 ENCOUNTER — Telehealth: Payer: Self-pay

## 2015-12-28 LAB — VARICELLA ZOSTER ANTIBODY, IGG: Varicella IgG: 340 index (ref >165)

## 2015-12-28 NOTE — Progress Notes (Signed)
Patient understands all discharge instructions and the need to make follow up appointments. Patient discharge via wheelchair with auxillary. 

## 2015-12-28 NOTE — Telephone Encounter (Signed)
Pt aware medicaid will not pay for iron. She will contact pharmacy and advise that she will have to be self pay.

## 2016-01-04 ENCOUNTER — Encounter: Payer: Self-pay | Admitting: Emergency Medicine

## 2016-01-04 ENCOUNTER — Emergency Department
Admission: EM | Admit: 2016-01-04 | Discharge: 2016-01-04 | Disposition: A | Discharge status: Home / Self Care | Payer: Medicaid Other | Attending: Student | Admitting: Student

## 2016-01-04 DIAGNOSIS — Z8669 Personal history of other diseases of the nervous system and sense organs: Secondary | ICD-10-CM | POA: Insufficient documentation

## 2016-01-04 DIAGNOSIS — O8612 Endometritis following delivery: Secondary | ICD-10-CM

## 2016-01-04 DIAGNOSIS — B954 Other streptococcus as the cause of diseases classified elsewhere: Secondary | ICD-10-CM | POA: Insufficient documentation

## 2016-01-04 DIAGNOSIS — R1030 Lower abdominal pain, unspecified: Secondary | ICD-10-CM | POA: Diagnosis present

## 2016-01-04 LAB — CBC
HCT: 36.2 % (ref 35.0–47.0)
Hemoglobin: 11.9 g/dL — ABNORMAL LOW (ref 12.0–16.0)
MCH: 25.7 pg — AB (ref 26.0–34.0)
MCHC: 33 g/dL (ref 32.0–36.0)
MCV: 77.9 fL — ABNORMAL LOW (ref 80.0–100.0)
PLATELETS: 293 10*3/uL (ref 150–440)
RBC: 4.64 MIL/uL (ref 3.80–5.20)
RDW: 14.4 % (ref 11.5–14.5)
WBC: 11.4 10*3/uL — AB (ref 3.6–11.0)

## 2016-01-04 LAB — BASIC METABOLIC PANEL
ANION GAP: 7 (ref 5–15)
BUN: 6 mg/dL (ref 6–20)
CO2: 26 mmol/L (ref 22–32)
Calcium: 8.6 mg/dL — ABNORMAL LOW (ref 8.9–10.3)
Chloride: 106 mmol/L (ref 101–111)
Creatinine, Ser: 0.56 mg/dL (ref 0.44–1.00)
GFR calc Af Amer: >60 mL/min (ref >60)
GFR calc non Af Amer: >60 mL/min (ref >60)
GLUCOSE: 102 mg/dL — AB (ref 65–99)
POTASSIUM: 3.5 mmol/L (ref 3.5–5.1)
SODIUM: 139 mmol/L (ref 135–145)

## 2016-01-04 LAB — URINALYSIS COMPLETE WITH MICROSCOPIC (ARMC ONLY)
Bilirubin Urine: NEGATIVE
Glucose, UA: NEGATIVE mg/dL
Ketones, ur: NEGATIVE mg/dL
Nitrite: NEGATIVE
PH: 6 (ref 5.0–8.0)
PROTEIN: NEGATIVE mg/dL
Specific Gravity, Urine: 1.009 (ref 1.005–1.030)

## 2016-01-04 MED ORDER — AMOXICILLIN-POT CLAVULANATE 875-125 MG PO TABS
1.0000 | ORAL_TABLET | Freq: Once | ORAL | Status: AC
  Administered 2016-01-04: 1 via ORAL
  Filled 2016-01-04: qty 1

## 2016-01-04 MED ORDER — ACETAMINOPHEN 500 MG PO TABS
1000.0000 mg | ORAL_TABLET | Freq: Once | ORAL | Status: AC
  Administered 2016-01-04: 1000 mg via ORAL
  Filled 2016-01-04: qty 2

## 2016-01-04 MED ORDER — AMOXICILLIN-POT CLAVULANATE 875-125 MG PO TABS: 1.0000 | ORAL_TABLET | Freq: Two times a day (BID) | ORAL | Status: DC

## 2016-01-04 NOTE — ED Provider Notes (Signed)
[facility] Emergency Department Provider Note   ____________________________________________  Time seen: Approximately 3:47 AM  I have reviewed the triage vital signs and the nursing notes.   HISTORY  Chief Complaint Abdominal Pain    HPI Kathleen Mcgrath is a 20 y.o. female Status post term uncomplicated vaginal delivery on 12/26/2015 who presents for evaluation of lower abdominal and back pain since yesterday, gradual onset, constant, no modifying factors, currently mild to moderate. She is also had chills, no fever. No cough, sneezing or runny nose. She has had foul-smelling urine. She also reports that she is having brownish tinged lochia which also has somewhat of a foul odor. She is not breast-feeding.   Past Medical History  Diagnosis Date  . Seizures [facility])     Patient Active Problem List   Diagnosis Date Noted  . Pregnancy 12/26/2015  . Choroid plexus cyst of fetus on prenatal ultrasound 08/17/2015  . Rubella non-immune status, antepartum 05/23/2015  . Maternal varicella, non-immune 05/23/2015  . 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 No pertinent past surgical history.  Current Outpatient Rx  Name  Route  Sig  Dispense  Refill  . acetaminophen (TYLENOL) 500 MG tablet   Oral   Take 500 mg by mouth every 6 (six) hours as needed.         . Iron-FA-B Cmp-C-Biot-Probiotic (FUSION PLUS) CAPS   Oral   Take 1 capsule by mouth daily.   60 capsule   1   . Prenatal MV-Min-Fe Fum-FA-DHA (PRENATAL 1 PO)   Oral   Take 1 tablet by mouth daily. Reported on 12/26/2015           Allergies Phenytoin  Family History  Problem Relation Age of Onset  . Breast cancer Maternal Grandmother   . Diabetes Mother   . Seizures Cousin     maternal  . Stroke Cousin     maternal  . Autism Paternal Grandfather     Social History Social History  Substance Use Topics  . Smoking status: Never Smoker   .  Smokeless tobacco: Never Used  . Alcohol Use: No    Review of Systems Constitutional: No fever/chills Eyes: No visual changes. ENT: No sore throat. Cardiovascular: Denies chest pain. Respiratory: Denies shortness of breath. Gastrointestinal: +abdominal pain.  No nausea, no vomiting.  No diarrhea.  No constipation. Genitourinary: Negative for dysuria. Musculoskeletal: Negative for back pain. Skin: Negative for rash. Neurological: Negative for headaches, focal weakness or numbness.  10-point ROS otherwise negative.  ____________________________________________   PHYSICAL EXAM:  Filed Vitals:   01/04/16 0209 01/04/16 0352  BP: 114/75 110/66  Pulse: 108 95  Temp: 98.9 F (37.2 C)   TempSrc: Oral   Resp: 20 18  Height:  (1.575 m)   Weight: 150 lb (68.04 kg)   SpO2: 98% 97%    VITAL SIGNS: ED Triage Vitals  Enc Vitals Group     BP 01/04/16 0209 114/75 mmHg     Pulse Rate 01/04/16 0209 108     Resp 01/04/16 0209 20     Temp 01/04/16 0209 98.9 F (37.2 C)     Temp Source 01/04/16 0209 Oral     SpO2 01/04/16 0209 98 %     Weight 01/04/16 0209 150 lb (68.04 kg)     Height 01/04/16 0209  (1.575 m)     Head Cir --      Peak Flow --  Pain Score 01/04/16 0209 6     Pain Loc --      Pain Edu? --      Excl. in GC? --     Constitutional: Alert and oriented. Well appearing and in no acute distress. Eyes: Conjunctivae are normal. PERRL. EOMI. Head: Atraumatic. Nose: No congestion/rhinnorhea. Mouth/Throat: Mucous membranes are moist.  Oropharynx non-erythematous. Neck: No stridor. Su Cardiovascular: Normal rate, regular rhythm. Grossly normal heart sounds.  Good peripheral circulation. Respiratory: Normal respiratory effort.  No retractions. Lungs CTAB. Gastrointestinal: Soft with mild suprapubic tenderness to palpation, firm uterine fundus is palpated above the pelvic brim. No CVA tenderness. Genitourinary: deferred Musculoskeletal: No lower extremity  tenderness nor edema.  No joint effusions. Neurologic:  Normal speech and language. No gross focal neurologic deficits are appreciated. No gait instability. Skin:  Skin is warm, dry and intact. No rash noted. Bilateral breasts without erythema, warmth, or induration. Psychiatric: Mood and affect are normal. Speech and behavior are normal.  ____________________________________________   LABS (all labs ordered are listed, but only abnormal results are displayed)  Labs Reviewed  CBC - Abnormal; Notable for the following:    WBC 11.4 (*)    Hemoglobin 11.9 (*)    MCV 77.9 (*)    MCH 25.7 (*)    All other components within normal limits  BASIC METABOLIC PANEL - Abnormal; Notable for the following:    Glucose, Bld 102 (*)    Calcium 8.6 (*)    All other components within normal limits  URINALYSIS COMPLETEWITH MICROSCOPIC ([facility] ONLY) - Abnormal; Notable for the following:    Color, Urine YELLOW (*)    APPearance CLEAR (*)    Hgb urine dipstick 2+ (*)    Leukocytes, UA 2+ (*)    Bacteria, UA RARE (*)    Squamous Epithelial / LPF 0-5 (*)    All other components within normal limits  URINE CULTURE   ____________________________________________  EKG  none ____________________________________________  RADIOLOGY  none ____________________________________________   PROCEDURES  Procedure(s) performed: None  Critical Care performed: No  ____________________________________________   INITIAL IMPRESSION / ASSESSMENT AND PLAN / ED COURSE  Pertinent labs & imaging results that were available during my care of the patient were reviewed by me and considered in my medical decision making (see chart for details).  Kathleen Mcgrath is a 20 y.o. female Status post term uncomplicated vaginal delivery on 12/26/2015 who presents for evaluation of lower abdominal and back pain since yesterday. On exam, she is very well-appearing and in no acute distress. Her vital signs are stable, she is  afebrile. She was mildly tachycardic on arrival however this has resolved at the time of my assessment. She has mild tenderness to palpation throughout the lower abdomen in the suprapubic region. I reviewed her labs. CBC shows very mild leukocytosis, white blood cell count 11.4, this is downtrending from prior. Hemoglobin is slightly increased from prior. BMP unremarkable. Urinalysis with 2+ leukocytes, too numerous to count white blood cells, rare bacteria which could represent UTI or could be contaminant/related to postpartum endometritis. I discussed the case with Dr. Vilma MeckelAnnika Cherry, on call for [facility], given the patient's well appearance and in the absence of fever, her recommendation is discharge with Augmentin and the patient will follow up in clinic. There is no recommendation for imaging at this time. A pelvic exam would be unlikely to change management so will defer. I discussed meticulous return precautions with the patient, need for close OB/GYN follow-up and she  is comfortable with the discharge plan. DC home. Urine culture sent. ____________________________________________   FINAL CLINICAL IMPRESSION(S) / ED DIAGNOSES  Final diagnoses:  Postpartum endometritis      NEW MEDICATIONS STARTED DURING THIS VISIT:  New Prescriptions   No medications on file     Note:  This document was prepared using Dragon voice recognition software and may include unintentional dictation errors.    Gayla Doss, MD 01/04/16 [phone]

## 2016-01-04 NOTE — ED Notes (Signed)
Patient ambulatory to triage with steady gait, without difficulty or distress noted; pt reports 1week post partum; having mid lower abd/back pain today with green colored urine & chills

## 2016-01-04 NOTE — ED Notes (Addendum)
Pt states lower abdominal pain and low back pain that began yesterday. Pt delivered vaginal last Monday. Pt states pain and difficulty urinating. Denies N&V or diarrhea. States chills, but no fever upon triage.

## 2016-01-04 NOTE — Discharge Instructions (Signed)
You were seen in the emergency department for symptoms which could be due to endometritis, an infection of you uterine lining which can happen after giving birth . Take the Augmentin as prescribed and call your OB/GYN as soon as possible to arrange follow-up appointment. Return immediately to the emergency department if you develop severe or worsening pain, fever greater than 100.4, vomiting, diarrhea, chest pain, difficulty breathing, heavy vaginal bleeding, if your vaginal discharge changes in any way, or for any other concerns.

## 2016-01-05 ENCOUNTER — Encounter: Payer: Medicaid Other | Admitting: Obstetrics and Gynecology

## 2016-01-05 LAB — URINE CULTURE

## 2016-02-08 ENCOUNTER — Ambulatory Visit (INDEPENDENT_AMBULATORY_CARE_PROVIDER_SITE_OTHER): Payer: Medicaid Other | Admitting: Obstetrics and Gynecology

## 2016-02-08 ENCOUNTER — Encounter: Payer: Self-pay | Admitting: Obstetrics and Gynecology

## 2016-02-08 VITALS — BP 116/71 | HR 82 | Ht 62.0 in | Wt 158.4 lb

## 2016-02-08 DIAGNOSIS — O8611 Cervicitis following delivery: Secondary | ICD-10-CM

## 2016-02-08 DIAGNOSIS — Z975 Presence of (intrauterine) contraceptive device: Secondary | ICD-10-CM | POA: Diagnosis not present

## 2016-02-08 LAB — POCT URINE PREGNANCY: Preg Test, Ur: NEGATIVE

## 2016-02-08 MED ORDER — DICLOXACILLIN SODIUM 500 MG PO CAPS: 500.0000 mg | ORAL_CAPSULE | Freq: Three times a day (TID) | ORAL | 1 refills | Status: DC

## 2016-02-08 MED ORDER — TINIDAZOLE 500 MG PO TABS: 500.0000 mg | ORAL_TABLET | Freq: Two times a day (BID) | ORAL | 1 refills | Status: DC

## 2016-02-08 NOTE — Progress Notes (Signed)
Subjective:     Patient ID: Kathleen Mcgrath, female   DOB: [birth year], 20 y.o.   MRN: [id number]  HPI Seen in ED and diagnosed with PP endometritis- took full course of antibiotics and reports green d/c still with foul odor for last 4 days. Is five weeks PP vaginal delivery and was here today for IUD insertion.  Review of Systems Denies fever or cramping. Has not had sex yet.    Objective:   Physical Exam A&O x4 Well groomed female in no distress Blood pressure 116/71, pulse 82, height 5\' 2"  (1.575 m), weight 158 lb 6.4 oz (71.8 kg), last menstrual period 02/02/2016, not currently breastfeeding. Pelvic exam: normal external genitalia, vulva, vagina, cervix, uterus and adnexa, CERVIX: cervical discharge present - copious, foul and green, WET MOUNT done - results: clue cells, white blood cells, + whiff, Nuswab obtained and sent in.    Assessment:     PP cervicitis    Plan:     Will wait on Mirena insertion. New rx sent in for Tindamax and dicloxacillin. Will recheck at next weeks PPV.  Elmer Merwin [place], CNM

## 2016-02-09 ENCOUNTER — Telehealth: Payer: Self-pay | Admitting: Obstetrics and Gynecology

## 2016-02-09 ENCOUNTER — Other Ambulatory Visit: Payer: Self-pay | Admitting: Obstetrics and Gynecology

## 2016-02-09 NOTE — Telephone Encounter (Signed)
Medicaid wont cover her medicaton Mel sent to pharmacy yesterday. She said the pharmacy told her to tell us to call Medicaid and say it was medically necessary.  (DYNAPEN) 500 MG

## 2016-02-10 ENCOUNTER — Other Ambulatory Visit: Payer: Self-pay | Admitting: *Deleted

## 2016-02-10 MED ORDER — DOXYCYCLINE MONOHYDRATE 100 MG PO TABS: 100.0000 mg | ORAL_TABLET | Freq: Three times a day (TID) | ORAL | 0 refills | Status: DC

## 2016-02-10 NOTE — Telephone Encounter (Signed)
Changed medications

## 2016-02-14 ENCOUNTER — Ambulatory Visit: Payer: Medicaid Other | Admitting: Obstetrics and Gynecology

## 2016-02-16 ENCOUNTER — Other Ambulatory Visit: Payer: Self-pay | Admitting: *Deleted

## 2016-02-16 ENCOUNTER — Encounter: Payer: Self-pay | Admitting: *Deleted

## 2016-02-16 MED ORDER — METRONIDAZOLE 500 MG PO TABS: 500.0000 mg | ORAL_TABLET | Freq: Two times a day (BID) | ORAL | 0 refills | Status: DC

## 2016-03-01 ENCOUNTER — Encounter: Payer: Self-pay | Admitting: Obstetrics and Gynecology

## 2016-03-01 ENCOUNTER — Ambulatory Visit (INDEPENDENT_AMBULATORY_CARE_PROVIDER_SITE_OTHER): Payer: Medicaid Other | Admitting: Obstetrics and Gynecology

## 2016-03-01 VITALS — BP 118/78 | HR 93 | Ht 62.0 in | Wt 160.0 lb

## 2016-03-01 DIAGNOSIS — Z30430 Encounter for insertion of intrauterine contraceptive device: Secondary | ICD-10-CM

## 2016-03-01 MED ORDER — LEVONORGESTREL 20 MCG/24HR IU IUD: INTRAUTERINE_SYSTEM | Freq: Once | INTRAUTERINE | Status: DC

## 2016-03-01 NOTE — Progress Notes (Signed)
Chrys RacerJenna N Vidana is a 20 y.o. year old [id number] Caucasian female who presents for placement of a Mirena IUD.  Patient's last menstrual period was 02/26/2016. BP 118/78   Pulse 93   Ht 5\' 2"  (1.575 m)   Wt 160 lb (72.6 kg)   LMP 02/26/2016   Breastfeeding? No   BMI 29.26 kg/m  Last sexual intercourse was two weeks ago, and pregnancy test today was negative.  Microscopic wet-mount exam shows negative for pathogens, normal epithelial cells.  The risks and benefits of the method and placement have been thouroughly reviewed with the patient and all questions were answered.  Specifically the patient is aware of failure rate of 07/998, expulsion of the IUD and of possible perforation.  The patient is aware of irregular bleeding due to the method and understands the incidence of irregular bleeding diminishes with time.  Signed copy of informed consent in chart.   Time out was performed.  A graves speculum was placed in the vagina.  The cervix was visualized, prepped using Betadine, and grasped with a single tooth tenaculum. The uterus was found to be neutral and it sounded to 8 cm.  Mirena IUD placed per manufacturer's recommendations.   The strings were trimmed to 3 cm.   The patient was given post procedure instructions, including signs and symptoms of infection and to check for the strings after each menses or each month, and refraining from intercourse or anything in the vagina for 3 days.  She was given a Mirena care card with date Mirena placed, and date Mirena to be removed.    Melody Suzan NailerN Shambley, CNM

## 2016-05-16 LAB — HM PAP SMEAR: HM Pap smear: NEGATIVE

## 2016-08-01 ENCOUNTER — Emergency Department
Admission: EM | Admit: 2016-08-01 | Discharge: 2016-08-01 | Disposition: A | Discharge status: Home / Self Care | Payer: Medicaid Other | Attending: Emergency Medicine | Admitting: Emergency Medicine

## 2016-08-01 ENCOUNTER — Encounter: Payer: Self-pay | Admitting: Emergency Medicine

## 2016-08-01 DIAGNOSIS — J069 Acute upper respiratory infection, unspecified: Secondary | ICD-10-CM | POA: Diagnosis not present

## 2016-08-01 DIAGNOSIS — R05 Cough: Secondary | ICD-10-CM | POA: Diagnosis present

## 2016-08-01 LAB — POCT RAPID STREP A: STREPTOCOCCUS, GROUP A SCREEN (DIRECT): NEGATIVE

## 2016-08-01 LAB — INFLUENZA PANEL BY PCR (TYPE A & B)
Influenza A By PCR: NEGATIVE
Influenza B By PCR: NEGATIVE

## 2016-08-01 MED ORDER — BENZONATATE 100 MG PO CAPS: 100.0000 mg | ORAL_CAPSULE | Freq: Three times a day (TID) | ORAL | 0 refills | Status: AC | PRN

## 2016-08-01 MED ORDER — OXYMETAZOLINE HCL 0.05 % NA SOLN: 1.0000 | Freq: Two times a day (BID) | NASAL | 2 refills | Status: AC

## 2016-08-01 NOTE — ED Triage Notes (Signed)
Patient with complaint of sore throat and left ear pain that started on Monday. Patient states that she was seen by pcp and stared on zpack yesterday but that she feels worse today.

## 2016-08-01 NOTE — ED Notes (Signed)
Pt verbalized understanding of discharge instructions. NAD at this time. 

## 2016-08-01 NOTE — ED Provider Notes (Signed)
[facility] Emergency Department Provider Note  ____________________________________________  Time seen: Approximately 7:56 AM  I have reviewed the triage vital signs and the nursing notes.   HISTORY  Chief Complaint Sore Throat and Otalgia    HPI Kathleen Mcgrath is a 21 y.o. female presenting to the emergency Department with nonproductive cough, pharyngitis and left ear otalgia since Monday. She denies fever or chills. She denies chest pain, chest tightness, shortness of breath, abdominal pain, nausea or vomiting. Patient states that she went to urgent care yesterday. Because she did not undergo chest x-ray, they started her on azithromycin. She denies recent travel. She is a stay-at-home mom. She has attempted no other alleviating measures besides azithromycin.   Past Medical History:  Diagnosis Date  . Seizures [facility])     Patient Active Problem List   Diagnosis Date Noted  . 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 No pertinent surgical history.  Prior to Admission medications   Medication Sig Start Date End Date Taking? Authorizing Provider  benzonatate (TESSALON PERLES) 100 MG capsule Take 1 capsule (100 mg total) by mouth 3 (three) times daily as needed for cough. 08/01/16 08/08/16  Orvil Feil, PA-C  dicloxacillin (DYNAPEN) 500 MG capsule Take 1 capsule (500 mg total) by mouth 3 (three) times daily. Patient not taking: Reported on 03/01/2016 02/08/16   Melody N Shambley, CNM  doxycycline (ADOXA) 100 MG tablet Take 1 tablet (100 mg total) by mouth 3 (three) times daily. Patient not taking: Reported on 03/01/2016 02/10/16   Melody N Shambley, CNM  metroNIDAZOLE (FLAGYL) 500 MG tablet Take 1 tablet (500 mg total) by mouth 2 (two) times daily. Patient not taking: Reported on 03/01/2016 02/16/16   Melody N Shambley, CNM  oxymetazoline (AFRIN) 0.05 % nasal spray Place 1 spray into both nostrils 2 (two) times daily. 08/01/16  08/08/16  Orvil Feil, PA-C  Prenatal MV-Min-Fe Fum-FA-DHA (PRENATAL 1 PO) Take 1 tablet by mouth daily. Reported on 12/26/2015    Historical Provider, MD  tinidazole (TINDAMAX) 500 MG tablet Take 1 tablet (500 mg total) by mouth 2 (two) times daily. Patient not taking: Reported on 03/01/2016 02/08/16   Melody N Shambley, CNM  topiramate (TOPAMAX) 25 MG tablet Take 25 mg by mouth 2 (two) times daily.    Historical Provider, MD    Allergies Phenytoin  Family History  Problem Relation Age of Onset  . Diabetes Mother   . Breast cancer Maternal Grandmother   . Autism Paternal Grandfather   . Seizures Cousin     maternal  . Stroke Cousin     maternal    Social History Social History  Substance Use Topics  . Smoking status: Never Smoker  . Smokeless tobacco: Never Used  . Alcohol use No     Review of Systems  Constitutional: No fever/chills Eyes: No visual changes. No discharge ENT: Left ear otalgia and pharyngitis  Cardiovascular: no chest pain. Respiratory: Non-productive cough. No SOB. Gastrointestinal: No abdominal pain.  No nausea, no vomiting.  No diarrhea.  No constipation. Musculoskeletal: Negative for musculoskeletal pain. Skin: Negative for rash, abrasions, lacerations, ecchymosis. Neurological: Negative for headaches, focal weakness or numbness. 10-point ROS otherwise negative.  ____________________________________________   PHYSICAL EXAM:  VITAL SIGNS: ED Triage Vitals  Enc Vitals Group     BP 08/01/16 0508 126/66     Pulse Rate 08/01/16 0508 (!) 113     Resp 08/01/16 0508 18     Temp  08/01/16 0508 98.4 F (36.9 C)     Temp Source 08/01/16 0508 Oral     SpO2 08/01/16 0508 97 %     Weight 08/01/16 0509 169 lb (76.7 kg)     Height 08/01/16 0509 5\' 3"  (1.6 m)     Head Circumference --      Peak Flow --      Pain Score 08/01/16 0509 7     Pain Loc --      Pain Edu? --      Excl. in GC? --      Constitutional: Alert and oriented. Patient is sitting  upright. Eyes: Palpebral and bulbar conjunctiva are nonerythematous bilaterally. PERRL. EOMI. No scleral icterus bilaterally. Head: Atraumatic. ENT:      Ears: Tympanic membranes are pearly bilaterally without erythema or purulent exudate. Left middle ear is mildly effused. Bony landmarks are visualized bilaterally.       Nose: Skin overlying nares is without erythema. Nasal turbinates are edematous. Nasal septum is midline.      Mouth/Throat: Mucous membranes are moist. Posterior pharynx is mildly erythematous. No tonsillar exudate, hypertrophy or petechiae visualized. Uvula is midline. Neck: Full range of motion. No pain with neck flexion. Hematological/Lymphatic/Immunilogical: No cervical lymphadenopathy.  Cardiovascular: Mildly tachycardic, regular rhythm. Normal S1 and S2. No murmurs, gallops or rubs auscultated.  Respiratory: Trachea is midline. No retractions or presence of deformity. Thoracic expansion is symmetric with unaccentuated tactile fremitus. Resonant and symmetric percussion tones bilaterally. On auscultation, adventitious sounds are absent.  Neurologic:  Normal for age. No gross focal neurologic deficits are appreciated.  Skin:  Skin is warm, dry and intact. No rash noted. Psychiatric: Mood and affect are normal for age. Speech and behavior are normal.    ____________________________________________   LABS (all labs ordered are listed, but only abnormal results are displayed)  Labs Reviewed  INFLUENZA PANEL BY PCR (TYPE A & B)  POCT RAPID STREP A   ____________________________________________  EKG   ____________________________________________  RADIOLOGY   No results found.  ____________________________________________    PROCEDURES  Procedure(s) performed:    Procedures    Medications - No data to display   ____________________________________________   INITIAL IMPRESSION / ASSESSMENT AND PLAN / ED COURSE  Pertinent labs & imaging results  that were available during my care of the patient were reviewed by me and considered in my medical decision making (see chart for details).  Review of the [place] CSRS was performed in accordance of the [facility] prior to dispensing any controlled drugs.  Clinical Course     Assessment and Plan: Upper Respiratory Tract Infection  Patient presents to the emergency department with  nonproductive cough, left ear otalgia and pharyngitis that started on Monday. Rapid influenza testing was negative in the emergency department. Rapid strep was also negative. Vital signs and physical exam are reassuring at this time. Viral upper respiratory tract infection is likely. Patient was discharged with tessalon pearls for nonproductive cough. All patient questions were answered. Patient was advised to follow-up with her primary care provider in one week.  ____________________________________________  FINAL CLINICAL IMPRESSION(S) / ED DIAGNOSES  Final diagnoses:  Viral upper respiratory tract infection      NEW MEDICATIONS STARTED DURING THIS VISIT:  Discharge Medication List as of 08/01/2016  8:03 AM    START taking these medications   Details  benzonatate (TESSALON PERLES) 100 MG capsule Take 1 capsule (100 mg total) by mouth 3 (three) times daily as needed for cough., Starting Wed  08/01/2016, Until Wed 08/08/2016, Print    oxymetazoline (AFRIN) 0.05 % nasal spray Place 1 spray into both nostrils 2 (two) times daily., Starting Wed 08/01/2016, Until Wed 08/08/2016, Print            This chart was dictated using voice recognition software/Dragon. Despite best efforts to proofread, errors can occur which can change the meaning. Any change was purely unintentional.    Orvil FeilJaclyn M Woods, PA-C 08/01/16 1618    Emily FilbertJonathan E Williams, MD 08/02/16 [phone]

## 2017-04-28 ENCOUNTER — Emergency Department
Admission: EM | Admit: 2017-04-28 | Discharge: 2017-04-28 | Disposition: A | Discharge status: Home / Self Care | Payer: Medicaid Other | Attending: Emergency Medicine | Admitting: Emergency Medicine

## 2017-04-28 DIAGNOSIS — Z5321 Procedure and treatment not carried out due to patient leaving prior to being seen by health care provider: Secondary | ICD-10-CM | POA: Insufficient documentation

## 2017-04-28 DIAGNOSIS — R569 Unspecified convulsions: Secondary | ICD-10-CM | POA: Diagnosis present

## 2017-04-28 LAB — CBC
HEMATOCRIT: 39.2 % (ref 35.0–47.0)
Hemoglobin: 13.1 g/dL (ref 12.0–16.0)
MCH: 28 pg (ref 26.0–34.0)
MCHC: 33.5 g/dL (ref 32.0–36.0)
MCV: 83.4 fL (ref 80.0–100.0)
PLATELETS: 244 10*3/uL (ref 150–440)
RBC: 4.7 MIL/uL (ref 3.80–5.20)
RDW: 13.9 % (ref 11.5–14.5)
WBC: 10.5 10*3/uL (ref 3.6–11.0)

## 2017-04-28 LAB — BASIC METABOLIC PANEL
ANION GAP: 11 (ref 5–15)
BUN: 16 mg/dL (ref 6–20)
CALCIUM: 9.1 mg/dL (ref 8.9–10.3)
CO2: 24 mmol/L (ref 22–32)
CREATININE: 0.71 mg/dL (ref 0.44–1.00)
Chloride: 106 mmol/L (ref 101–111)
Glucose, Bld: 121 mg/dL — ABNORMAL HIGH (ref 65–99)
Potassium: 3.3 mmol/L — ABNORMAL LOW (ref 3.5–5.1)
SODIUM: 141 mmol/L (ref 135–145)

## 2017-04-28 NOTE — ED Notes (Signed)
Patient does not take Keppra anymore. Was started on a new medication but she is not sure what the name of it is. She thinks it starts with an L.

## 2017-04-28 NOTE — ED Triage Notes (Signed)
Patient stated to have had seizure today involved arms only. SO states she just had blank stare and arms started shaking. Denies incontinence of bowel or bladder. Patient is alert and oriented and answers questions appropriately. States her ams and and legs are sore. No tongue laceration.

## 2017-04-29 ENCOUNTER — Telehealth: Payer: Self-pay | Admitting: Emergency Medicine

## 2017-04-29 NOTE — Telephone Encounter (Signed)
Called patient due to lwot to inquire about condition and follow up plans.  Number is not in service.  

## 2018-04-30 LAB — HM HIV SCREENING LAB: HM HIV Screening: NEGATIVE

## 2019-01-26 ENCOUNTER — Other Ambulatory Visit: Payer: Self-pay

## 2019-01-26 ENCOUNTER — Ambulatory Visit (LOCAL_COMMUNITY_HEALTH_CENTER): Payer: Medicaid Other | Admitting: Physician Assistant

## 2019-01-26 ENCOUNTER — Encounter: Payer: Self-pay | Admitting: Physician Assistant

## 2019-01-26 VITALS — BP 124/81 | Ht 63.0 in | Wt 205.0 lb

## 2019-01-26 DIAGNOSIS — Z30011 Encounter for initial prescription of contraceptive pills: Secondary | ICD-10-CM

## 2019-01-26 DIAGNOSIS — Z113 Encounter for screening for infections with a predominantly sexual mode of transmission: Secondary | ICD-10-CM

## 2019-01-26 DIAGNOSIS — Z3009 Encounter for other general counseling and advice on contraception: Secondary | ICD-10-CM | POA: Diagnosis not present

## 2019-01-26 MED ORDER — NORGESTIMATE-ETH ESTRADIOL 0.25-35 MG-MCG PO TABS: 1.0000 | ORAL_TABLET | Freq: Every day | ORAL | 3 refills | Status: DC

## 2019-01-26 NOTE — Progress Notes (Signed)
Family Planning Visit- Repeat Yearly Visit  Subjective:  Kathleen Mcgrath is a 23 y.o. being seen today  to discuss family planning options.    She is currently using none for pregnancy prevention. Patient reports she does not  wantsa pregnancy in the next year. Patient has the following medical conditionshas Intractable juvenile myoclonic epilepsy ([place]) on their problem list.  Chief Complaint  Patient presents with  . Contraception    pt desires OCP    Patient reports that she is not having any symptoms.  Would like to restart OCs as she has used them in the past without any problems.  Does not know the name of the OCs she used in the past but they had 2 colors of pills in the package.  Declines ECP and any pelvic exam today.  Requests HIV and Syphilis blood work only.  Requests that OCs be sent to [facility] in [facility] electronically.  Patient denies any concerns today.   Does the patient desire a pregnancy in the next year? (OKQ flowsheet)  See flowsheet for other program required questions.   Body mass index is 36.31 kg/m. - Patient is eligible for diabetes screening based on BMI and age >60?  not applicable FU9N ordered? not applicable  Patient reports 2 of partners in last year. Desires STI screening?  No - .  Does the patient have a current or past history of drug use? No   No components found for: HCV]   There are no preventive care reminders to display for this patient.  Review of Systems  All other systems reviewed and are negative.   The following portions of the patient's history were reviewed and updated as appropriate: allergies, current medications, past family history, past medical history, past social history, past surgical history and problem list. Problem list updated.  Objective:   Vitals:   01/26/19 1607 01/26/19 1632  BP: 136/86 124/81  Weight: 205 lb (93 kg)   Height: 5\' 3"  (1.6 m)     Physical Exam Vitals signs reviewed.  Constitutional:      General:  She is not in acute distress.    Appearance: Normal appearance.  HENT:     Head: Normocephalic and atraumatic.  Pulmonary:     Effort: Pulmonary effort is normal.  Neurological:     Mental Status: She is alert and oriented to person, place, and time.  Psychiatric:        Mood and Affect: Mood normal.        Behavior: Behavior normal.   no other exam done today.  Due to COVID-19, physical exams not being done and patient declines pelvic and self-collect today.    Assessment and Plan:  Kathleen Mcgrath is a 23 y.o. female presenting to the [facility] Department for an initial well woman exam/family planning visit  Contraception counseling: Reviewed all forms of birth control options available including abstinence; over the counter/barrier methods; hormonal contraceptive medication including pill, patch, ring, injection,contraceptive implant; hormonal and nonhormonal IUDs; permanent sterilization options including vasectomy and the various tubal sterilization modalities. Risks and benefits reviewed.  Questions were answered.  Written information was also given to the patient to review.  Patient desires OCs, this was prescribed for patient. She will follow up in  3-4 months and prn for surveillance.  She was told to call with any further questions, or with any concerns about this method of contraception.  Emphasized use of condoms 100% of the time for STI prevention.  There are no diagnoses linked to this encounter.    No follow-ups on file.  No future appointments.  Matt Holmesarla J Kennady Zimmerle, PA

## 2019-04-01 ENCOUNTER — Other Ambulatory Visit: Payer: Self-pay

## 2019-04-01 DIAGNOSIS — Z20822 Contact with and (suspected) exposure to covid-19: Secondary | ICD-10-CM

## 2019-04-02 LAB — NOVEL CORONAVIRUS, NAA: SARS-CoV-2, NAA: NOT DETECTED

## 2019-05-19 ENCOUNTER — Other Ambulatory Visit: Payer: Self-pay | Admitting: *Deleted

## 2019-05-19 DIAGNOSIS — Z20822 Contact with and (suspected) exposure to covid-19: Secondary | ICD-10-CM

## 2019-05-20 LAB — NOVEL CORONAVIRUS, NAA: SARS-CoV-2, NAA: NOT DETECTED

## 2019-10-15 ENCOUNTER — Telehealth: Payer: Self-pay | Admitting: Certified Nurse Midwife

## 2019-10-15 NOTE — Telephone Encounter (Signed)
Pt called in and stated  That she took a home test today and it came back negative . The pt was wanting to know if she should keep or cancel her appt . The pt is requesting a call back. Please advise

## 2019-10-15 NOTE — Telephone Encounter (Signed)
Please contact patient. Was this a single negative after several positives? Is she having bleeding? JML

## 2019-10-15 NOTE — Telephone Encounter (Signed)
LMTRC

## 2019-10-19 NOTE — Telephone Encounter (Signed)
LMP- 09/13/2019- Unsure???  + hpt- 10/06/19  -hpt- 10/09/2019  NO bleeding or cramping. NO B/c.  Encouraged pt to keep appt on 4/9. If pt develops bleeding or cramps to contact the office.

## 2019-10-19 NOTE — Telephone Encounter (Signed)
Kathleen Mcgrath pt called back and stated she took two test the first was positive and the second negative. She has also had no bleeding.

## 2019-10-19 NOTE — Telephone Encounter (Signed)
May keep appointment to collect blood level of pregnancy hormone, if desired. JML

## 2019-10-23 ENCOUNTER — Ambulatory Visit (INDEPENDENT_AMBULATORY_CARE_PROVIDER_SITE_OTHER): Payer: Medicaid Other | Admitting: Certified Nurse Midwife

## 2019-10-23 ENCOUNTER — Encounter: Payer: Self-pay | Admitting: Certified Nurse Midwife

## 2019-10-23 ENCOUNTER — Other Ambulatory Visit: Payer: Self-pay

## 2019-10-23 VITALS — BP 114/73 | HR 105 | Ht 63.0 in | Wt 189.0 lb

## 2019-10-23 DIAGNOSIS — Z3201 Encounter for pregnancy test, result positive: Secondary | ICD-10-CM

## 2019-10-23 DIAGNOSIS — N926 Irregular menstruation, unspecified: Secondary | ICD-10-CM | POA: Diagnosis not present

## 2019-10-23 LAB — POCT URINE PREGNANCY: Preg Test, Ur: POSITIVE — AB

## 2019-10-23 NOTE — Patient Instructions (Signed)
Healthy Weight Gain During Pregnancy, Adult A certain amount of weight gain during pregnancy is normal and healthy. How much weight you should gain depends on your overall health and a measurement called BMI (body mass index). BMI is an estimate of your body fat based on your height and weight. You can use an online calculator to figure out your BMI, or you can ask your health care provider to calculate it for you at your next visit. Your recommended pregnancy weight gain is based on your pre-pregnancy BMI. General guidelines for a healthy total weight gain during pregnancy are listed below. If your BMI at or before the start of your pregnancy is:  Less than 18.5 (underweight), you should gain 28-40 lb (13-18 kg).  18.5-24.9 (normal weight), you should gain 25-35 lb (11-16 kg).  25-29.9 (overweight), you should gain 15-25 lb (7-11 kg).  30 or higher (obese), you should gain 11-20 lb (5-9 kg). These ranges vary depending on your individual health. If you are carrying more than one baby (multiples), it may be safe to gain more weight than these recommendations. If you gain less weight than recommended, that may be safe as long as your baby is growing and developing normally. How can unhealthy weight gain affect me and my baby? Gaining too much weight during pregnancy can lead to pregnancy complications, such as:  A temporary form of diabetes that develops during pregnancy (gestational diabetes).  High blood pressure during pregnancy and protein in your urine (preeclampsia).  High blood pressure during pregnancy without protein in your urine (gestational hypertension).  Your baby having a high weight at birth, which may: ? Raise your risk of having a more difficult delivery or a surgical delivery (cesarean delivery, or C-section). ? Raise your child's risk of developing obesity during childhood. Not gaining enough weight can be life-threatening for your baby, and it may raise your baby's chances  of:  Being born early (preterm).  Growing more slowly than normal during pregnancy (growth restriction).  Having a low weight at birth. What actions can I take to gain a healthy amount of weight during pregnancy? General instructions  Keep track of your weight gain during pregnancy.  Take over-the-counter and prescription medicines only as told by your health care provider. Take all prenatal supplements as directed.  Keep all health care visits during pregnancy (prenatal visits). These visits are a good time to discuss your weight gain. Your health care provider will weigh you at each visit to make sure you are gaining a healthy amount of weight. Nutrition   Eat a balanced, nutrient-rich diet. Eat plenty of: ? Fruits and vegetables, such as berries and broccoli. ? Whole grains, such as millet, barley, whole-wheat breads and cereals, and oatmeal. ? Low-fat dairy products or non-dairy products such as almond milk or rice milk. ? Protein foods, such as lean meat, chicken, eggs, and legumes (such as peas, beans, soybeans, and lentils).  Avoid foods that are fried or have a lot of fat, salt (sodium), or sugar.  Drink enough fluid to keep your urine pale yellow.  Choose healthy snack and drink options when you are at work or on the go: ? Drink water. Avoid soda, sports drinks, and juices that have added sugar. ? Avoid drinks with caffeine, such as coffee and energy drinks. ? Eat snacks that are high in protein, such as nuts, protein bars, and low-fat yogurt. ? Carry convenient snacks in your purse that do not need refrigeration, such as a pack of   trail mix, an apple, or a granola bar.  If you need help improving your diet, work with a health care provider or a diet and nutrition specialist (dietitian). Activity   Exercise regularly, as told by your health care provider. ? If you were active before becoming pregnant, you may be able to continue your regular fitness activities. ? If  you were not active before pregnancy, you may gradually build up to exercising for 30 or more minutes on most days of the week. This may include walking, swimming, or yoga.  Ask your health care provider what activities are safe for you. Talk with your health care provider about whether you may need to be excused from certain school or work activities. Where to find more information Learn more about managing your weight gain during pregnancy from:  American Pregnancy Association: www.americanpregnancy.org  U.S. Department of Agriculture pregnancy weight gain calculator: www.choosemyplate.gov Summary  Too much weight gain during pregnancy can lead to complications for you and your baby.  Find out your pre-pregnancy BMI to determine how much weight gain is healthy for you.  Eat nutritious foods and stay active.  Keep all of your prenatal visits as told by your health care provider. This information is not intended to replace advice given to you by your health care provider. Make sure you discuss any questions you have with your health care provider. Document Revised: 03/25/2019 Document Reviewed: 03/22/2017 Elsevier Patient Education  2020 Elsevier Inc.   Common Medications Safe in Pregnancy  Acne:      Constipation:  Benzoyl Peroxide     Colace  Clindamycin      Dulcolax Suppository  Topica Erythromycin     Fibercon  Salicylic Acid      Metamucil         Miralax AVOID:        Senakot   Accutane    Cough:  Retin-A       Cough Drops  Tetracycline      Phenergan w/ Codeine if Rx  Minocycline      Robitussin (Plain & DM)  Antibiotics:     Crabs/Lice:  Ceclor       RID  Cephalosporins    AVOID:  E-Mycins      Kwell  Keflex  Macrobid/Macrodantin   Diarrhea:  Penicillin      Kao-Pectate  Zithromax      Imodium AD         PUSH FLUIDS AVOID:       Cipro     Fever:  Tetracycline      Tylenol (Regular or Extra  Minocycline       Strength)  Levaquin      Extra Strength-Do not              Exceed 8 tabs/24 hrs Caffeine:        <200mg/day (equiv. To 1 cup of coffee or  approx. 3 12 oz sodas)         Gas: Cold/Hayfever:       Gas-X  Benadryl      Mylicon  Claritin       Phazyme  **Claritin-D        Chlor-Trimeton    Headaches:  Dimetapp      ASA-Free Excedrin  Drixoral-Non-Drowsy     Cold Compress  Mucinex (Guaifenasin)     Tylenol (Regular or Extra  Sudafed/Sudafed-12 Hour     Strength)  **Sudafed PE Pseudoephedrine   Tylenol Cold & Sinus       Vicks Vapor Rub  Zyrtec  **AVOID if Problems With Blood Pressure         Heartburn: Avoid lying down for at least 1 hour after meals  Aciphex      Maalox     Rash:  Milk of Magnesia     Benadryl    Mylanta       1% Hydrocortisone Cream  Pepcid  Pepcid Complete   Sleep Aids:  Prevacid      Ambien   Prilosec       Benadryl  Rolaids       Chamomile Tea  Tums (Limit 4/day)     Unisom  Zantac       Tylenol PM         Warm milk-add vanilla or  Hemorrhoids:       Sugar for taste  Anusol/Anusol H.C.  (RX: Analapram 2.5%)  Sugar Substitutes:  Hydrocortisone OTC     Ok in moderation  Preparation H      Tucks        Vaseline lotion applied to tissue with wiping    Herpes:     Throat:  Acyclovir      Oragel  Famvir  Valtrex     Vaccines:         Flu Shot Leg Cramps:       *Gardasil  Benadryl      Hepatitis A         Hepatitis B Nasal Spray:       Pneumovax  Saline Nasal Spray     Polio Booster         Tetanus Nausea:       Tuberculosis test or PPD  Vitamin B6 25 mg TID   AVOID:    Dramamine      *Gardasil  Emetrol       Live Poliovirus  Ginger Root 250 mg QID    MMR (measles, mumps &  High Complex Carbs @ Bedtime    rebella)  Sea Bands-Accupressure    Varicella (Chickenpox)  Unisom 1/2 tab TID     *No known complications           If received before Pain:         Known pregnancy;   Darvocet       Resume series after  Lortab        Delivery  Percocet    Yeast:   Tramadol      Femstat  Tylenol  3      Gyne-lotrimin  Ultram       Monistat  Vicodin           MISC:         All Sunscreens           Hair Coloring/highlights          Insect Repellant's          (Including DEET)         Mystic Tans   First Trimester of Pregnancy  The first trimester of pregnancy is from week 1 until the end of week 13 (months 1 through 3). During this time, your baby will begin to develop inside you. At 6-8 weeks, the eyes and face are formed, and the heartbeat can be seen on ultrasound. At the end of 12 weeks, all the baby's organs are formed. Prenatal care is all the medical care you receive before the birth of your baby. Make sure you get good prenatal care and follow  all of your doctor's instructions. Follow these instructions at home: Medicines  Take over-the-counter and prescription medicines only as told by your doctor. Some medicines are safe and some medicines are not safe during pregnancy.  Take a prenatal vitamin that contains at least 600 micrograms (mcg) of folic acid.  If you have trouble pooping (constipation), take medicine that will make your stool soft (stool softener) if your doctor approves. Eating and drinking   Eat regular, healthy meals.  Your doctor will tell you the amount of weight gain that is right for you.  Avoid raw meat and uncooked cheese.  If you feel sick to your stomach (nauseous) or throw up (vomit): ? Eat 4 or 5 small meals a day instead of 3 large meals. ? Try eating a few soda crackers. ? Drink liquids between meals instead of during meals.  To prevent constipation: ? Eat foods that are high in fiber, like fresh fruits and vegetables, whole grains, and beans. ? Drink enough fluids to keep your pee (urine) clear or pale yellow. Activity  Exercise only as told by your doctor. Stop exercising if you have cramps or pain in your lower belly (abdomen) or low back.  Do not exercise if it is too hot, too humid, or if you are in a place of great height (high  altitude).  Try to avoid standing for long periods of time. Move your legs often if you must stand in one place for a long time.  Avoid heavy lifting.  Wear low-heeled shoes. Sit and stand up straight.  You can have sex unless your doctor tells you not to. Relieving pain and discomfort  Wear a good support bra if your breasts are sore.  Take warm water baths (sitz baths) to soothe pain or discomfort caused by hemorrhoids. Use hemorrhoid cream if your doctor says it is okay.  Rest with your legs raised if you have leg cramps or low back pain.  If you have puffy, bulging veins (varicose veins) in your legs: ? Wear support hose or compression stockings as told by your doctor. ? Raise (elevate) your feet for 15 minutes, 3-4 times a day. ? Limit salt in your food. Prenatal care  Schedule your prenatal visits by the twelfth week of pregnancy.  Write down your questions. Take them to your prenatal visits.  Keep all your prenatal visits as told by your doctor. This is important. Safety  Wear your seat belt at all times when driving.  Make a list of emergency phone numbers. The list should include numbers for family, friends, the hospital, and police and fire departments. General instructions  Ask your doctor for a referral to a local prenatal class. Begin classes no later than at the start of month 6 of your pregnancy.  Ask for help if you need counseling or if you need help with nutrition. Your doctor can give you advice or tell you where to go for help.  Do not use hot tubs, steam rooms, or saunas.  Do not douche or use tampons or scented sanitary pads.  Do not cross your legs for long periods of time.  Avoid all herbs and alcohol. Avoid drugs that are not approved by your doctor.  Do not use any tobacco products, including cigarettes, chewing tobacco, and electronic cigarettes. If you need help quitting, ask your doctor. You may get counseling or other support to help you  quit.  Avoid cat litter boxes and soil used by cats. These carry germs that  can cause birth defects in the baby and can cause a loss of your baby (miscarriage) or stillbirth.  Visit your dentist. At home, brush your teeth with a soft toothbrush. Be gentle when you floss. Contact a doctor if:  You are dizzy.  You have mild cramps or pressure in your lower belly.  You have a nagging pain in your belly area.  You continue to feel sick to your stomach, you throw up, or you have watery poop (diarrhea).  You have a bad smelling fluid coming from your vagina.  You have pain when you pee (urinate).  You have increased puffiness (swelling) in your face, hands, legs, or ankles. Get help right away if:  You have a fever.  You are leaking fluid from your vagina.  You have spotting or bleeding from your vagina.  You have very bad belly cramping or pain.  You gain or lose weight rapidly.  You throw up blood. It may look like coffee grounds.  You are around people who have [place] measles, fifth disease, or chickenpox.  You have a very bad headache.  You have shortness of breath.  You have any kind of trauma, such as from a fall or a car accident. Summary  The first trimester of pregnancy is from week 1 until the end of week 13 (months 1 through 3).  To take care of yourself and your unborn baby, you will need to eat healthy meals, take medicines only if your doctor tells you to do so, and do activities that are safe for you and your baby.  Keep all follow-up visits as told by your doctor. This is important as your doctor will have to ensure that your baby is healthy and growing well. This information is not intended to replace advice given to you by your health care provider. Make sure you discuss any questions you have with your health care provider. Document Revised: 10/23/2018 Document Reviewed: 07/10/2016 Elsevier Patient Education  2020 [employer].

## 2019-10-23 NOTE — Progress Notes (Signed)
GYN ENCOUNTER NOTE  Subjective:       Kathleen Mcgrath is a 24 y.o. G82P2002 female here for pregnancy confirmation.   Reports three (3) positive home pregnancy tests and one (1) negative test.   Endorses nausea and occasional vomiting with rare abdominal cramping.   Denies difficulty breathing or respiratory distress, chest pain, dysuria, and leg pain or swelling.   History significant for epilepsy, taking Lamictal and Keppra daily.    Gynecologic History  Patient's last menstrual period was 09/04/2019 (approximate).  Gestational age: 34 weeks 5 days  Estimated date of birth: 06/10/2022  Contraception: none  Last Pap: 2017. Results were: normal  Obstetric History OB History  Gravida Para Term Preterm AB Living  2 2 2     2   SAB TAB Ectopic Multiple Live Births        0 2    # Outcome Date GA Lbr Len/2nd Weight Sex Delivery Anes PTL Lv  2 Term 12/26/15 [redacted]w[redacted]d 16:00 / 01:24 6 lb 11 oz (3.033 kg) F Vag-Spont EPI  LIV  1 Term 12/07/11 [redacted]w[redacted]d  6 lb 13 oz (3.09 kg) F Vag-Spont  N LIV     Complications: Oligohydramnios    Past Medical History:  Diagnosis Date  . Seizures ([place])   . Seizures ([place])     No past surgical history on file.  Current Outpatient Medications on File Prior to Visit  Medication Sig Dispense Refill  . lamoTRIgine (LAMICTAL) 200 MG tablet Take 200 mg by mouth 2 (two) times daily.    Marland Kitchen levETIRAcetam (KEPPRA) 250 MG tablet Take 250 mg by mouth 2 (two) times daily.    . Prenatal MV-Min-Fe Fum-FA-DHA (PRENATAL 1 PO) Take 1 tablet by mouth daily. Reported on 12/26/2015    . dicloxacillin (DYNAPEN) 500 MG capsule Take 1 capsule (500 mg total) by mouth 3 (three) times daily. (Patient not taking: Reported on 03/01/2016) 21 capsule 1  . doxycycline (ADOXA) 100 MG tablet Take 1 tablet (100 mg total) by mouth 3 (three) times daily. (Patient not taking: Reported on 03/01/2016) 21 tablet 0  . metroNIDAZOLE (FLAGYL) 500 MG tablet Take 1 tablet (500 mg total) by mouth 2  (two) times daily. (Patient not taking: Reported on 03/01/2016) 14 tablet 0  . norgestimate-ethinyl estradiol (ORTHO-CYCLEN, 28,) 0.25-35 MG-MCG tablet Take 1 tablet by mouth daily. (Patient not taking: Reported on 10/23/2019) 1 Package 3  . tinidazole (TINDAMAX) 500 MG tablet Take 1 tablet (500 mg total) by mouth 2 (two) times daily. (Patient not taking: Reported on 03/01/2016) 14 tablet 1  . topiramate (TOPAMAX) 25 MG tablet Take 25 mg by mouth 2 (two) times daily.    . traZODone (DESYREL) 50 MG tablet Take 50 mg by mouth at bedtime.     No current facility-administered medications on file prior to visit.    Allergies  Allergen Reactions  . Phenytoin Anaphylaxis and Itching    Social History   Socioeconomic History  . Marital status: Single    Spouse name: Not on file  . Number of children: Not on file  . Years of education: Not on file  . Highest education level: Not on file  Occupational History  . Occupation: homemaker  Tobacco Use  . Smoking status: Never Smoker  . Smokeless tobacco: Never Used  Substance and Sexual Activity  . Alcohol use: No    Alcohol/week: 0.0 standard drinks  . Drug use: No  . Sexual activity: Yes    Partners: Male  Birth control/protection: None  Other Topics Concern  . Not on file  Social History Narrative  . Not on file   Social Determinants of Health   Financial Resource Strain:   . Difficulty of Paying Living Expenses:   Food Insecurity:   . Worried About Programme researcher, broadcasting/film/video in the Last Year:   . Barista in the Last Year:   Transportation Needs:   . Freight forwarder (Medical):   Marland Kitchen Lack of Transportation (Non-Medical):   Physical Activity:   . Days of Exercise per Week:   . Minutes of Exercise per Session:   Stress:   . Feeling of Stress :   Social Connections:   . Frequency of Communication with Friends and Family:   . Frequency of Social Gatherings with Friends and Family:   . Attends Religious Services:   .  Active Member of Clubs or Organizations:   . Attends Banker Meetings:   Marland Kitchen Marital Status:   Intimate Partner Violence:   . Fear of Current or Ex-Partner:   . Emotionally Abused:   Marland Kitchen Physically Abused:   . Sexually Abused:     Family History  Problem Relation Age of Onset  . Diabetes Mother   . Hypertension Mother   . Hypertension Father   . Breast cancer Maternal Grandmother   . Autism Paternal Grandfather   . Seizures Cousin        maternal  . Stroke Cousin        maternal    The following portions of the patient's history were reviewed and updated as appropriate: allergies, current medications, past family history, past medical history, past social history, past surgical history and problem list.  Review of Systems  ROS negative except as noted above. Information obtained from patient.   Objective:   BP 114/73   Pulse (!) 105   Ht 5\' 3"  (1.6 m)   Wt 189 lb (85.7 kg)   LMP 09/04/2019 (Approximate)   BMI 33.48 kg/m  CONSTITUTIONAL: Well-developed, well-nourished female in no acute distress.   PHYSICAL: Not indicated.   Recent Results (from the past 2160 hour(s))  POCT urine pregnancy     Status: Abnormal   Collection Time: 10/23/19  3:53 PM  Result Value Ref Range   Preg Test, Ur Positive (A) Negative  Beta hCG quant (ref lab)     Status: None   Collection Time: 10/23/19  4:44 PM  Result Value Ref Range   hCG Quant 45 mIU/mL    Comment:                      Female (Non-pregnant)    0 -     5                             (Postmenopausal)  0 -     8                      Female (Pregnant)                      Weeks of Gestation                              3                6 -  71                              4               10 -   750                              5              217 -  7138                              6              158 - 31795                              7             [id number] -[id number]                              8             32065 -[id number]                              9            [id number] -[id number]                             10            46509 -[id number]                             12            27832 -[id number]                             14            13950 - 62530                             15            12039 - 70971                             16             9040 - 56451                             17             8175 - 55868                             18             8099 - 58176 Roche E CLIA methodology     Assessment:   1. Missed menses  - POCT urine pregnancy - Beta hCG quant (ref  lab)  2. Positive pregnancy test  - Beta hCG quant (ref lab)  Plan:   Labs today, see orders.   Advised 1 mg folic acid daily.   Encouraged to follow up with Neurology.   First trimester education, see AVS.   Reviewed red flag symptoms and when to call.   RTC 2-3 weeks for dating/viability ultrasound and nurse intake.   RTC x 5-6 weeks for NOB PE or sooner if needed.    Gunnar Bulla, CNM [facility], [facility] 10/26/19 11:03 AM

## 2019-10-24 LAB — BETA HCG QUANT (REF LAB): hCG Quant: 45 m[IU]/mL

## 2019-10-26 ENCOUNTER — Telehealth: Payer: Self-pay | Admitting: Certified Nurse Midwife

## 2019-10-26 DIAGNOSIS — Z3201 Encounter for pregnancy test, result positive: Secondary | ICD-10-CM

## 2019-10-26 DIAGNOSIS — N926 Irregular menstruation, unspecified: Secondary | ICD-10-CM

## 2019-10-26 NOTE — Telephone Encounter (Signed)
1543 Telephone call to patient, verified full name and date of birth.   Lab visit scheduled Wednesday for repeat beta.   Reviewed red flag symptoms and when to call.   RTC as previously scheduled.    Gunnar Bulla, CNM [facility], [facility] 10/26/19 3:45 PM

## 2019-10-26 NOTE — Telephone Encounter (Signed)
Patient called in stating that she was looking in her MyChart and was looking at her lab work and she was confused about what everything meant. Patient would like a call back from provider.

## 2019-10-28 ENCOUNTER — Other Ambulatory Visit: Payer: Medicaid Other

## 2019-10-28 ENCOUNTER — Other Ambulatory Visit: Payer: Self-pay

## 2019-10-28 DIAGNOSIS — N926 Irregular menstruation, unspecified: Secondary | ICD-10-CM

## 2019-10-28 DIAGNOSIS — Z3201 Encounter for pregnancy test, result positive: Secondary | ICD-10-CM

## 2019-10-29 ENCOUNTER — Other Ambulatory Visit: Payer: Medicaid Other

## 2019-10-29 DIAGNOSIS — N926 Irregular menstruation, unspecified: Secondary | ICD-10-CM

## 2019-10-29 DIAGNOSIS — Z3201 Encounter for pregnancy test, result positive: Secondary | ICD-10-CM

## 2019-10-29 LAB — BETA HCG QUANT (REF LAB): hCG Quant: 193 m[IU]/mL

## 2019-10-29 NOTE — Telephone Encounter (Signed)
Please contact patient to schedule lab appt tomorrow for repeat beta. Thanks, JML

## 2019-10-30 LAB — BETA HCG QUANT (REF LAB): hCG Quant: 270 m[IU]/mL

## 2019-11-03 ENCOUNTER — Other Ambulatory Visit: Payer: Medicaid Other

## 2019-11-03 ENCOUNTER — Other Ambulatory Visit: Payer: Self-pay

## 2019-11-03 DIAGNOSIS — Z3201 Encounter for pregnancy test, result positive: Secondary | ICD-10-CM

## 2019-11-03 DIAGNOSIS — N926 Irregular menstruation, unspecified: Secondary | ICD-10-CM

## 2019-11-04 ENCOUNTER — Encounter: Payer: Self-pay | Admitting: Certified Nurse Midwife

## 2019-11-04 ENCOUNTER — Ambulatory Visit (INDEPENDENT_AMBULATORY_CARE_PROVIDER_SITE_OTHER): Payer: Medicaid Other | Admitting: Certified Nurse Midwife

## 2019-11-04 ENCOUNTER — Telehealth: Payer: Self-pay | Admitting: Certified Nurse Midwife

## 2019-11-04 ENCOUNTER — Other Ambulatory Visit: Payer: Self-pay | Admitting: Certified Nurse Midwife

## 2019-11-04 ENCOUNTER — Ambulatory Visit (INDEPENDENT_AMBULATORY_CARE_PROVIDER_SITE_OTHER): Payer: Medicaid Other

## 2019-11-04 VITALS — BP 123/72 | HR 110 | Wt 192.1 lb

## 2019-11-04 DIAGNOSIS — Z3A01 Less than 8 weeks gestation of pregnancy: Secondary | ICD-10-CM

## 2019-11-04 DIAGNOSIS — O26859 Spotting complicating pregnancy, unspecified trimester: Secondary | ICD-10-CM

## 2019-11-04 DIAGNOSIS — R58 Hemorrhage, not elsewhere classified: Secondary | ICD-10-CM | POA: Diagnosis not present

## 2019-11-04 LAB — BETA HCG QUANT (REF LAB): hCG Quant: 367 m[IU]/mL

## 2019-11-04 NOTE — Telephone Encounter (Signed)
Pt called in and stated that she was passing blood clots and that she was feeling nauseas  and having side pain. I called back to the nurse and the nurse advise me to put the pt on for today.

## 2019-11-04 NOTE — Progress Notes (Signed)
Not seen by me, Nurse visit only

## 2019-11-05 ENCOUNTER — Other Ambulatory Visit: Payer: Self-pay

## 2019-11-05 ENCOUNTER — Telehealth: Payer: Self-pay

## 2019-11-05 ENCOUNTER — Encounter: Payer: Self-pay | Admitting: Emergency Medicine

## 2019-11-05 ENCOUNTER — Telehealth: Payer: Self-pay | Admitting: Certified Nurse Midwife

## 2019-11-05 DIAGNOSIS — O200 Threatened abortion: Secondary | ICD-10-CM | POA: Diagnosis not present

## 2019-11-05 DIAGNOSIS — Z3A01 Less than 8 weeks gestation of pregnancy: Secondary | ICD-10-CM | POA: Diagnosis not present

## 2019-11-05 DIAGNOSIS — O209 Hemorrhage in early pregnancy, unspecified: Secondary | ICD-10-CM | POA: Diagnosis present

## 2019-11-05 LAB — COMPREHENSIVE METABOLIC PANEL
ALT: 21 U/L (ref 0–44)
AST: 17 U/L (ref 15–41)
Albumin: 4.3 g/dL (ref 3.5–5.0)
Alkaline Phosphatase: 97 U/L (ref 38–126)
Anion gap: 8 (ref 5–15)
BUN: 10 mg/dL (ref 6–20)
CO2: 27 mmol/L (ref 22–32)
Calcium: 9.1 mg/dL (ref 8.9–10.3)
Chloride: 103 mmol/L (ref 98–111)
Creatinine, Ser: 0.63 mg/dL (ref 0.44–1.00)
GFR calc Af Amer: >60 mL/min (ref >60)
GFR calc non Af Amer: >60 mL/min (ref >60)
Glucose, Bld: 117 mg/dL — ABNORMAL HIGH (ref 70–99)
Potassium: 3.9 mmol/L (ref 3.5–5.1)
Sodium: 138 mmol/L (ref 135–145)
Total Bilirubin: 0.4 mg/dL (ref 0.3–1.2)
Total Protein: 7.5 g/dL (ref 6.5–8.1)

## 2019-11-05 LAB — CBC
HCT: 38.8 % (ref 36.0–46.0)
Hemoglobin: 12.5 g/dL (ref 12.0–15.0)
MCH: 27.8 pg (ref 26.0–34.0)
MCHC: 32.2 g/dL (ref 30.0–36.0)
MCV: 86.2 fL (ref 80.0–100.0)
Platelets: 344 10*3/uL (ref 150–400)
RBC: 4.5 MIL/uL (ref 3.87–5.11)
RDW: 12.8 % (ref 11.5–15.5)
WBC: 9.8 10*3/uL (ref 4.0–10.5)
nRBC: 0 % (ref 0.0–0.2)

## 2019-11-05 LAB — URINALYSIS, COMPLETE (UACMP) WITH MICROSCOPIC
Bacteria, UA: NONE SEEN
Bilirubin Urine: NEGATIVE
Glucose, UA: NEGATIVE mg/dL
Ketones, ur: NEGATIVE mg/dL
Nitrite: NEGATIVE
Protein, ur: NEGATIVE mg/dL
RBC / HPF: >50 RBC/hpf — ABNORMAL HIGH (ref 0–5)
Specific Gravity, Urine: 1.02 (ref 1.005–1.030)
pH: 5 (ref 5.0–8.0)

## 2019-11-05 LAB — POCT PREGNANCY, URINE: Preg Test, Ur: POSITIVE — AB

## 2019-11-05 LAB — ABO/RH: ABO/RH(D): A POS

## 2019-11-05 LAB — HCG, QUANTITATIVE, PREGNANCY: hCG, Beta Chain, Quant, S: 396 m[IU]/mL — ABNORMAL HIGH (ref <5)

## 2019-11-05 NOTE — Progress Notes (Addendum)
Received a call from [place]- patient was calling to report heavy bleeding with clots and cramping. Due to pregnancy diagnosis patient was given an appointment along with an ultrasound appointment for viability. Patient arrived at clinic and was taken to ultrasound. [place] reported no bleeding or clots visible on ultrasound. When patient was asked about bleeding and passing clots- she stated " no just a spot when I wipe." Denied cramping. No eye contact with patient. Informed her the next U/S would be at 20 weeks for anatomy scan. Patient was encouraged to keep all previously scheduled appointments. Patient was in agreement.

## 2019-11-05 NOTE — Telephone Encounter (Signed)
Please see documentation dated 11/04/19

## 2019-11-05 NOTE — ED Triage Notes (Signed)
Patient ambulatory to triage with steady gait, without difficulty or distress noted, mask in place; pt st seen yesterday at [facility] for spotting; today having increased bleeding and cramping; approx [redacted]wks pregnant; G3P2

## 2019-11-05 NOTE — Telephone Encounter (Signed)
Patient called in saying her vaginal bleeding was becoming heavier today. Could you please advise?

## 2019-11-06 ENCOUNTER — Emergency Department: Payer: Medicaid Other

## 2019-11-06 ENCOUNTER — Emergency Department
Admission: EM | Admit: 2019-11-06 | Discharge: 2019-11-06 | Disposition: A | Discharge status: Home / Self Care | Payer: Medicaid Other | Attending: Emergency Medicine | Admitting: Emergency Medicine

## 2019-11-06 DIAGNOSIS — O4690 Antepartum hemorrhage, unspecified, unspecified trimester: Secondary | ICD-10-CM

## 2019-11-06 DIAGNOSIS — O200 Threatened abortion: Secondary | ICD-10-CM

## 2019-11-06 NOTE — Discharge Instructions (Addendum)
Return to the ER for worsening symptoms, soaking more than 1 pad per hour, fainting or other concerns.

## 2019-11-06 NOTE — Telephone Encounter (Signed)
mychart message sent to patient

## 2019-11-06 NOTE — ED Provider Notes (Signed)
[facility] Emergency Department Provider Note   ____________________________________________   First MD Initiated Contact with Patient 11/06/19 0134     (approximate)  I have reviewed the triage vital signs and the nursing notes.   HISTORY  Chief Complaint Vaginal Bleeding    HPI Kathleen Mcgrath is a 24 y.o. female G3 P2-0-0-2 approximately [redacted] weeks pregnant by dates who presents with vaginal bleeding.  Patient was seen at [facility] a couple of days ago for same; at that time she states she was only spotting. HCG 367 on 4/21.  Had ultrasound in the office which did not demonstrate bleeding or clots visible on ultrasound.  States bleeding heavier and she was passing clots but now seems to be better.  Last sexual intercourse last week.  Denies fever, cough, chest pain, shortness of breath, nominal pain, nausea, vomiting or dysuria.     Past Medical History:  Diagnosis Date  . Seizures ([place])   . Seizures [facility])     Patient Active Problem List   Diagnosis Date Noted  . 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 No pertinent surgical history.  Prior to Admission medications   Medication Sig Start Date End Date Taking? Authorizing Provider  dicloxacillin (DYNAPEN) 500 MG capsule Take 1 capsule (500 mg total) by mouth 3 (three) times daily. Patient not taking: Reported on 03/01/2016 02/08/16   Joylene Igo, CNM  doxycycline (ADOXA) 100 MG tablet Take 1 tablet (100 mg total) by mouth 3 (three) times daily. Patient not taking: Reported on 03/01/2016 02/10/16   Joylene Igo, CNM  lamoTRIgine (LAMICTAL) 200 MG tablet Take 200 mg by mouth 2 (two) times daily.    [provider]  levETIRAcetam (KEPPRA) 250 MG tablet Take 250 mg by mouth 2 (two) times daily.    [provider]  metroNIDAZOLE (FLAGYL) 500 MG tablet Take 1 tablet (500 mg total) by mouth 2 (two) times daily. Patient not taking: Reported on  03/01/2016 02/16/16   Joylene Igo, CNM  norgestimate-ethinyl estradiol (ORTHO-CYCLEN, 28,) 0.25-35 MG-MCG tablet Take 1 tablet by mouth daily. Patient not taking: Reported on 10/23/2019 01/26/19   Jerene Dilling, PA  Prenatal MV-Min-Fe Fum-FA-DHA (PRENATAL 1 PO) Take 1 tablet by mouth daily. Reported on 12/26/2015    [provider]  tinidazole (TINDAMAX) 500 MG tablet Take 1 tablet (500 mg total) by mouth 2 (two) times daily. Patient not taking: Reported on 03/01/2016 02/08/16   Joylene Igo, CNM  topiramate (TOPAMAX) 25 MG tablet Take 25 mg by mouth 2 (two) times daily.    [provider]  traZODone (DESYREL) 50 MG tablet Take 50 mg by mouth at bedtime.    [provider]    Allergies Phenytoin  Family History  Problem Relation Age of Onset  . Diabetes Mother   . Hypertension Mother   . Hypertension Father   . Breast cancer Maternal Grandmother   . Autism Paternal Grandfather   . Seizures Cousin        maternal  . Stroke Cousin        maternal    Social History Social History   Tobacco Use  . Smoking status: Never Smoker  . Smokeless tobacco: Never Used  Substance Use Topics  . Alcohol use: No    Alcohol/week: 0.0 standard drinks  . Drug use: No    Review of Systems  Constitutional: No fever/chills Eyes: No visual changes. ENT: No sore throat. Cardiovascular:  Denies chest pain. Respiratory: Denies shortness of breath. Gastrointestinal: No abdominal pain.  No nausea, no vomiting.  No diarrhea.  No constipation. Genitourinary: Positive for vaginal bleeding.  Negative for dysuria. Musculoskeletal: Negative for back pain. Skin: Negative for rash. Neurological: Negative for headaches, focal weakness or numbness.   ____________________________________________   PHYSICAL EXAM:  VITAL SIGNS: ED Triage Vitals  Enc Vitals Group     BP 11/05/19 2225 114/67     Pulse Rate 11/05/19 2225 98     Resp 11/05/19 2225 18     Temp 11/05/19  2225 98.6 F (37 C)     Temp Source 11/05/19 2225 Oral     SpO2 11/05/19 2225 100 %     Weight 11/05/19 2225 192 lb (87.1 kg)     Height 11/05/19 2225 5\' 3"  (1.6 m)     Head Circumference --      Peak Flow --      Pain Score 11/05/19 2222 5     Pain Loc --      Pain Edu? --      Excl. in GC? --     Constitutional: Alert and oriented. Well appearing and in no acute distress. Eyes: Conjunctivae are normal. PERRL. EOMI. Head: Atraumatic. Nose: No congestion/rhinnorhea. Mouth/Throat: Mucous membranes are moist.  Oropharynx non-erythematous. Neck: No stridor.   Cardiovascular: Normal rate, regular rhythm. Grossly normal heart sounds.  Good peripheral circulation. Respiratory: Normal respiratory effort.  No retractions. Lungs CTAB. Gastrointestinal: Soft and nontender to light or deep palpation. No distention. No abdominal bruits. No CVA tenderness. Pelvic exam: External exam within normal limits without rashes, lesions or vesicles.  Speculum exam reveals no active bleeding.  Mild old blood noted.  Cervical os is closed.  Normal bimanual exam.   Musculoskeletal: No lower extremity tenderness nor edema.  No joint effusions. Neurologic:  Normal speech and language. No gross focal neurologic deficits are appreciated. No gait instability. Skin:  Skin is warm, dry and intact. No rash noted. Psychiatric: Mood and affect are normal. Speech and behavior are normal.  ____________________________________________   LABS (all labs ordered are listed, but only abnormal results are displayed)  Labs Reviewed  COMPREHENSIVE METABOLIC PANEL - Abnormal; Notable for the following components:      Result Value   Glucose, Bld 117 (*)    All other components within normal limits  HCG, QUANTITATIVE, PREGNANCY - Abnormal; Notable for the following components:   hCG, Beta Chain, Quant, S 396 (*)    All other components within normal limits  URINALYSIS, COMPLETE (UACMP) WITH MICROSCOPIC - Abnormal; Notable  for the following components:   Color, Urine YELLOW (*)    APPearance CLEAR (*)    Hgb urine dipstick LARGE (*)    Leukocytes,Ua SMALL (*)    RBC / HPF >50 (*)    All other components within normal limits  POCT PREGNANCY, URINE - Abnormal; Notable for the following components:   Preg Test, Ur POSITIVE (*)    All other components within normal limits  CBC  ABO/RH   ____________________________________________  EKG  None ____________________________________________  RADIOLOGY  ED MD interpretation: Probable intrauterine gestational sac; 2 cm right ovarian corpus luteal cyst  Official radiology report(s): 11/07/19 OB LESS THAN 14 WEEKS WITH OB TRANSVAGINAL  Result Date: 11/06/2019 CLINICAL DATA:  Initial evaluation for vaginal bleeding, cramping, early pregnancy. EXAM: OBSTETRIC <14 WK 11/08/2019 AND TRANSVAGINAL OB US TECHNIQUE: Both transabdominal and transvaginal ultrasound examinations were performed for complete evaluation of the gestation  as well as the maternal uterus, adnexal regions, and pelvic cul-de-sac. Transvaginal technique was performed to assess early pregnancy. COMPARISON:  Prior ultrasound from 11/04/2019. FINDINGS: Intrauterine gestational sac: Single Yolk sac:  Negative. Embryo:  Negative. Cardiac Activity: Negative. Heart Rate: N/A  bpm MSD: 2.8 mm   5 w   0 d Subchorionic hemorrhage:  None visualized. Maternal uterus/adnexae: 1.8 x 2.2 x 1.3 cm corpus luteal cyst noted within the right ovary. Left ovary unremarkable. No adnexal mass or free fluid. IMPRESSION: 1. Probable early intrauterine gestational sac, but no yolk sac, fetal pole, or cardiac activity yet visualized. Recommend follow-up quantitative B-HCG levels and follow-up US in 14 days to confirm and assess viability. This recommendation follows SRU consensus guidelines: Diagnostic Criteria for Nonviable Pregnancy Early in the First Trimester. Malva Limes Med 2013; 867:6195-09. 2. 2.2 cm right ovarian corpus luteal cyst. 3. No  other acute maternal uterine or adnexal abnormality identified. Electronically Signed   By: Rise Mu M.D.   On: 11/06/2019 03:02    ____________________________________________   PROCEDURES  Procedure(s) performed (including Critical Care):  Procedures   ____________________________________________   INITIAL IMPRESSION / ASSESSMENT AND PLAN / ED COURSE  As part of my medical decision making, I reviewed the following data within the electronic MEDICAL RECORD NUMBER Nursing notes reviewed and incorporated, Labs reviewed, Old chart reviewed, Radiograph reviewed and Notes from prior ED visits     Kathleen Mcgrath was evaluated in Emergency Department on 11/06/2019 for the symptoms described in the history of present illness. She was evaluated in the context of the global COVID-19 pandemic, which necessitated consideration that the patient might be at risk for infection with the SARS-CoV-2 virus that causes COVID-19. Institutional protocols and algorithms that pertain to the evaluation of patients at risk for COVID-19 are in a state of rapid change based on information released by regulatory bodies including the CDC and federal and state organizations. These policies and algorithms were followed during the patient's care in the ED.    24 year old female presenting with first trimester vaginal bleeding. Differential diagnosis includes, but is not limited to, ovarian cyst, ovarian torsion, acute appendicitis, diverticulitis, urinary tract infection/pyelonephritis, endometriosis, bowel obstruction, colitis, renal colic, gastroenteritis, hernia, fibroids, endometriosis, pregnancy related pain including ectopic pregnancy, etc.  I have personally reviewed patient's records and see her hCG from 2 days ago. Would expect her CG to be higher tonight.  Will proceed with pelvic ultrasound.   Clinical Course as of Nov 05 421  Fri Nov 06, 2019  3267 Patient resting in no acute distress.  Updated her  on imaging studies.  She will follow up with [facility].  Strict return precautions given.  Patient verbalizes understanding and agrees with plan of care.   [JS]    Clinical Course User Index [JS] Irean Hong, MD     ____________________________________________   FINAL CLINICAL IMPRESSION(S) / ED DIAGNOSES  Final diagnoses:  Threatened miscarriage  Vaginal bleeding in pregnancy     ED Discharge Orders    None       Note:  This document was prepared using Dragon voice recognition software and may include unintentional dictation errors.   Irean Hong, MD 11/06/19 [phone]

## 2019-11-09 ENCOUNTER — Encounter: Payer: Medicaid Other | Admitting: Certified Nurse Midwife

## 2019-11-12 ENCOUNTER — Other Ambulatory Visit: Payer: Medicaid Other

## 2019-12-03 ENCOUNTER — Other Ambulatory Visit: Payer: Medicaid Other

## 2019-12-04 ENCOUNTER — Encounter: Payer: Medicaid Other | Admitting: Certified Nurse Midwife

## 2019-12-08 ENCOUNTER — Other Ambulatory Visit: Payer: Medicaid Other

## 2019-12-18 ENCOUNTER — Encounter: Payer: Medicaid Other | Admitting: Certified Nurse Midwife

## 2020-05-04 DIAGNOSIS — Z3490 Encounter for supervision of normal pregnancy, unspecified, unspecified trimester: Secondary | ICD-10-CM | POA: Insufficient documentation

## 2020-05-04 DIAGNOSIS — O0992 Supervision of high risk pregnancy, unspecified, second trimester: Secondary | ICD-10-CM | POA: Insufficient documentation

## 2020-05-12 ENCOUNTER — Ambulatory Visit: Payer: Medicaid Other | Attending: Maternal & Fetal Medicine | Admitting: Maternal & Fetal Medicine

## 2020-05-12 ENCOUNTER — Other Ambulatory Visit: Payer: Self-pay

## 2020-05-12 VITALS — BP 131/70 | HR 106 | Temp 98.8°F | Wt 198.5 lb

## 2020-05-12 DIAGNOSIS — Z3A09 9 weeks gestation of pregnancy: Secondary | ICD-10-CM

## 2020-05-12 DIAGNOSIS — Z3401 Encounter for supervision of normal first pregnancy, first trimester: Secondary | ICD-10-CM

## 2020-05-12 DIAGNOSIS — G40B19 Juvenile myoclonic epilepsy, intractable, without status epilepticus: Secondary | ICD-10-CM | POA: Diagnosis not present

## 2020-05-12 DIAGNOSIS — O99351 Diseases of the nervous system complicating pregnancy, first trimester: Secondary | ICD-10-CM | POA: Diagnosis not present

## 2020-05-12 NOTE — Progress Notes (Signed)
MFM consultation   Requesting provider: Margaretmary Eddy, CNM Date of Service: 05/12/20 Reason for request: Seizure disorder on medications.   Ms. Hallisey is a 24 yo G4P2 at 9w 2d who is seen at the request of Ms. Tami Lin, CNM regarding her seizure disorder.  She is doing well today without complaints. She denies vaginal bleeding or uterine cramping.  She reports that she plans to have genetic screening and is scheduled to have an early 1hr GTT given her family history of diabetes.  With regards to her seizure disorder. Ms. Lender notes that she was diagnosed with juvenile absence seizure disorder at age 16 yo. She has been prescribed several medications but currently is taking Keppra and Lamictal, which she feels is a good regimen. She reports that her last seizure has been greater than one year.   Her last Neurology appointment 06/09/19 with Harlin Rain, PA. Her last known seizure activity was on 06/20/2017 per Alvira Philips. 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  In her prior to pregnancies she discontinued her seizure medications due to concern of fetal malformation risks. She has continued her medication in this pregnancy but reduced her therapy from BID to once per day. She has not notified her neurologist of her pregnancy.   Vitals with BMI 05/12/2020 11/06/2019 11/05/2019  Height - - 5\' 3"   Weight 198 lbs 8 oz - 192 lbs  BMI - - 34.02  Systolic 131 115  Diastolic 70 70 67  Pulse 106 80 98    CBC Latest Ref Rng & Units 11/05/2019 04/28/2017 01/04/2016  WBC 4.0 - 10.5 K/uL 9.8 10.5 11.4(H)  Hemoglobin 12.0 - 15.0 g/dL 01/06/2016 33.2 11.9(L)  Hematocrit 36 - 46 % 38.8 39.2 36.2  Platelets 150 - 400 K/uL 344 244 293    CMP Latest Ref Rng & Units 11/05/2019 04/28/2017 01/04/2016  Glucose 70 - 99 mg/dL 01/06/2016) 884(Z) 660(Y)  BUN 6 - 20 mg/dL 10 16 6   Creatinine 0.44 - 1.00 mg/dL 301(S 0.10  Sodium 135 - 145 mmol/L 138 141 139  Potassium 3.5 - 5.1 mmol/L 3.9 3.3(L) 3.5   Chloride 98 - 111 mmol/L 103 106 106  CO2 22 - 32 mmol/L 27 24 26   Calcium 8.9 - 10.3 mg/dL 9.1 9.1 9.32)  Total Protein 6.5 - 8.1 g/dL 7.5 - -  Total Bilirubin 0.3 - 1.2 mg/dL 0.4 - -  Alkaline Phos 38 - 126 U/L 97 - -  AST 15 - 41 U/L 17 - -  ALT 0 - 44 U/L 21 - -   Past Medical History:  Diagnosis Date   Seizures ([facility])    Seizures ([facility])    History reviewed. No pertinent surgical history. Family History  Problem Relation Age of Onset   Diabetes Mother    Hypertension Mother    Hypertension Father    Breast cancer Maternal Grandmother    Autism Paternal Grandfather    Seizures Cousin        maternal   Stroke Cousin        maternal   Social History   Socioeconomic History   Marital status: Single    Spouse name: Not on file   Number of children: Not on file   Years of education: Not on file   Highest education level: Not on file  Occupational History   Occupation: homemaker  Tobacco Use   Smoking status: Never Smoker   Smokeless tobacco: Never Used  3.55 Use:  Never used  Substance and Sexual Activity   Alcohol use: No    Alcohol/week: 0.0 standard drinks   Drug use: No   Sexual activity: Yes    Partners: Male    Birth control/protection: None  Other Topics Concern   Not on file  Social History Narrative   Not on file   Social Determinants of Health   Financial Resource Strain:    Difficulty of Paying Living Expenses: Not on file  Food Insecurity:    Worried About Programme researcher, broadcasting/film/video in the Last Year: Not on file   [employer] of Food in the Last Year: Not on file  Transportation Needs:    Lack of Transportation (Medical): Not on file   Lack of Transportation (Non-Medical): Not on file  Physical Activity:    Days of Exercise per Week: Not on file   Minutes of Exercise per Session: Not on file  Stress:    Feeling of Stress : Not on file  Social Connections:    Frequency of Communication with Friends and  Family: Not on file   Frequency of Social Gatherings with Friends and Family: Not on file   Attends Religious Services: Not on file   Active Member of Clubs or Organizations: Not on file   Attends Banker Meetings: Not on file   Marital Status: Not on file  Intimate Partner Violence:    Fear of Current or Ex-Partner: Not on file   Emotionally Abused: Not on file   Physically Abused: Not on file   Sexually Abused: Not on file   Allergies  Allergen Reactions   Phenytoin Anaphylaxis and Itching   Current Outpatient Medications on File Prior to Visit  Medication Sig Dispense Refill   lamoTRIgine (LAMICTAL) 200 MG tablet Take 200 mg by mouth 2 (two) times daily.     levETIRAcetam (KEPPRA) 250 MG tablet Take 250 mg by mouth 2 (two) times daily.     Prenatal MV-Min-Fe Fum-FA-DHA (PRENATAL 1 PO) Take 1 tablet by mouth daily. Reported on 12/26/2015     No current facility-administered medications on file prior to visit.    Imaging:  Impression/Counseling:  Seizure disorder With respect to seizure disorder in pregnancy, we discussed that patients usually do well during pregnancy.  The most important factor seems to be the frequency of seizures prior to pregnancy.  Pregnancy has unpredictable and variable influences on epilepsy. When seizure frequency increases, it most commonly does so in the first trimester and usually reverts to the pregestational pattern at the conclusion of the pregnancy, although a few patients experience a permanent deterioration in seizure control. In general, control in patients with frequent seizures (i.e., more than one a month) before pregnancy is likely to deteriorate during the gestational period, whereas only about 25% of patients with infrequent attacks (i.e., less than one every 9 months) experience an exacerbation during pregnancies. We did review some of the factors that were responsible for eliciting seizure activity the most  concerning factor is sleep deprivation.  Other associated exacerbating factors include rapid excessive weight gain or general fatigue. I explained that due to her last seizure being in 2018 and prior pregnancies being seizure free I expect she should remain seizure free during the pregnancy.   Exposure to anticonvulsant medications is associated with an increased risk of congenital anomalies such as neural tube, congenital heart and urinary tract defects, skeletal abnormalities, and cleft palate. Polytherapy is associated with a higher risk of abnormalities than monotherapy. Lamotrigine (  Lamictal) and levetiracetam (Keppra) appear to have a lower rate of associated malformations than valproate, phenytoin, phenobarbital, and carbamazapine. (Discussed further and specifically below).   We recommend that a targeted ultrasound and maternal serum screening be performed between the 16th and 20th weeks of gestation and a follow up growth exam between 28-32 weeks.   With regard to care for the patient during pregnancy, several studies have shown a slightly increased risk of IUGR for fetuses exposed in-utero to anticonvulsant medications.  Generally, we recommend that if weight gain and fundal growth appear appropriate, regular ultrasound examinations for weight assessment are probably unnecessary.     Finally, most patients with epilepsy are able to labor normally and achieve a vaginal delivery. Elective cesarean section may be considered in patients refractory to treatment during the third trimester or in those that exhibit status epilepticus (SE) with significant stress. During labor, repeated seizures that cannot be controlled or SE may require an operative delivery. Fetal asphyxia can occur with prolonged or repeated seizures. Cesarean delivery may also be considered when repeated absence or psychomotor seizures limit maternal awareness and ability to cooperate. Tonic-clonic seizures are seen in approximately 1%  to 2% of women during labor. Lorazepam, a short-acting benzodiazepine, is the drug of choice for treating seizures acutely. The drug is administered in 2-mg boluses every 5 minutes as necessary. Some use 5- to 10-mg boluses of diazepam as an alternative.  Regarding specific drug therapy I reviewed summarized literature found in [email or web address].  Lamictal: Lamotrigine: I discussed with Ms. Scheel that lamotrigine was reported in one registry to be associated with oral clefts however, this has not been confirmed in other registries or larger studies. In addition, I dicussed that Lamotrigine is associated with decreasing therapeutic levels due to maternal plasma volume changes. Therefore we recommend assessing lamotrigine levels every trimester.   Keppra: Levetiracetam We discussed that there were no association with fetal malformations. However, there is a small registry suggesting increased association for fetal growth restriction. However, this has not been confirmed in larger studies.   We lastly, discussed the principle of the lowest effective dose and preferred monotherapy vs polytherapy. I noted that in most studies evaluating polytherapy of keppra and lamictal , there has been no associated increased risk for preterm birth, microcephaly, impaired neurocognitive behavior, fetal malformations or fetal growth restriction.   I also encouraged her to speak with her Neurologist regarding her pregnancy, specifically her desire to decrease her medications.   I spent 45 minutes with > 50% in face to face consultation.  All questions answered.  Novella Olive, MD.

## 2020-06-01 LAB — OB RESULTS CONSOLE VARICELLA ZOSTER ANTIBODY, IGG: Varicella: IMMUNE

## 2020-06-01 LAB — OB RESULTS CONSOLE HEPATITIS B SURFACE ANTIGEN: Hepatitis B Surface Ag: NEGATIVE

## 2020-06-01 LAB — OB RESULTS CONSOLE RUBELLA ANTIBODY, IGM: Rubella: IMMUNE

## 2020-07-16 NOTE — L&D Delivery Note (Signed)
Delivery Note  Date of delivery: 12/08/2020 Estimated Date of Delivery: 12/13/20 Patient's last menstrual period was 03/08/2020. EGA: [redacted]w[redacted]d  Delivery Note At 1:36 AM a viable female was delivered via Vaginal, Spontaneous (Presentation: Left Occiput Anterior).  APGAR: 9, 9; weight  2990g 6lb9oz.  Placenta status: Spontaneous, Intact.  Cord: 3 vessels with the following complications: None.     First Stage: Labor onset: unknown Augmentation/Induction: Cytotec, Pitocin Analgesia /Anesthesia intrapartum: Epidural SROM at 2300  Kathleen Mcgrath, Kathleen Mcgrath presented to L&D for IOL. She was given 2 doses of cytotec and then started on pitocin. Epidural placed for pain relief.   Second Stage: Complete dilation at 0108 Onset of pushing at 0126 FHR second stage Cat I ./ Cat II Delivery at 0136 on 12/08/2020  She progressed to complete and had a spontaneous vaginal birth of a live female over an intact perineuma. The fetal head was delivered in OA position with restitution to LOA. No nuchal cord. Anterior then posterior shoulders delivered spontaneously. Baby placed on mom's abdomen and attended to by transition RN. Cord clamped and cut when pulseless by FOB.   Third Stage: Placenta delivered intact with 3VC at 0140 Placenta disposition: routine disposal Uterine tone firm / bleeding min IV pitocin given for hemorrhage prophylaxis  Anesthesia: Epidural Episiotomy: None Lacerations:  None Suture Repair: n/a Est. Blood Loss (mL):   Complications: none  Mom to postpartum.  Baby to Couplet care / Skin to Skin.  Newborn: Birth Weight: 2990g 6lb9oz  Apgar Scores: 9, 9 Feeding planned: breastfeeding   [name], CNM 12/08/2020 1:52 AM

## 2020-09-12 ENCOUNTER — Observation Stay
Admission: EM | Admit: 2020-09-12 | Discharge: 2020-09-12 | Disposition: A | Discharge status: Home / Self Care | Payer: Medicaid Other | Attending: Obstetrics and Gynecology | Admitting: Obstetrics and Gynecology

## 2020-09-12 ENCOUNTER — Encounter: Payer: Self-pay | Admitting: Obstetrics and Gynecology

## 2020-09-12 ENCOUNTER — Other Ambulatory Visit: Payer: Self-pay

## 2020-09-12 DIAGNOSIS — O368120 Decreased fetal movements, second trimester, not applicable or unspecified: Secondary | ICD-10-CM | POA: Diagnosis present

## 2020-09-12 DIAGNOSIS — Z3A26 26 weeks gestation of pregnancy: Secondary | ICD-10-CM | POA: Insufficient documentation

## 2020-09-12 NOTE — OB Triage Note (Signed)
Patient being discharged per CNM verbal order. NST reactive. No other concerns at this time.

## 2020-09-12 NOTE — OB Triage Note (Signed)
Patient G4P2 [redacted]w[redacted]d is sent from office for decreased fetal movement. Patient states she is "not kicking as hard and as much as she normally does", but patient reports feeling movement when she got to hospital. Patient denies contractions, leaking of fluid and vaginal bleeding. She reports vaginal bleeding in the past, but none of concern today. Patient denies any pain and other concerns at this time. Vital signs stable. Monitors applied and assessing.

## 2020-09-12 NOTE — Discharge Summary (Signed)
Kathleen Mcgrath is a 25 y.o. female. She is at [redacted]w[redacted]d gestation. Patient's last menstrual period was 03/08/2020. Estimated Date of Delivery: 12/13/20  Prenatal care site: [facility]  Current pregnancy complicated by:  1. Obesity, 2. Seizure d/o- currently taking Lamictal and Keppra daily   Chief complaint: "not kicking as hard and as much as she normally does", but patient reports feeling movement when she got to hospital. Patient denies contractions, leaking of fluid and vaginal bleeding. She reports vaginal bleeding in the past, but none of concern today. Patient denies any pain and other concerns at this time.   S: Resting comfortably. no CTX, no VB.no LOF,  Active fetal movement noted now. Denies: HA, visual changes, SOB, or RUQ/epigastric pain  Maternal Medical History:   Past Medical History:  Diagnosis Date  . Seizures (HCC)   . Seizures (HCC)     History reviewed. No pertinent surgical history.  Allergies  Allergen Reactions  . Phenytoin Anaphylaxis and Itching    Prior to Admission medications   Medication Sig Start Date End Date Taking? Authorizing Provider  lamoTRIgine (LAMICTAL) 200 MG tablet Take 200 mg by mouth 2 (two) times daily.   Yes [provider]  levETIRAcetam (KEPPRA) 250 MG tablet Take 250 mg by mouth 2 (two) times daily.   Yes [provider]  Prenatal MV-Min-Fe Fum-FA-DHA (PRENATAL 1 PO) Take 1 tablet by mouth daily. Reported on 12/26/2015 Patient not taking: Reported on 09/12/2020    [provider]      Social History: She  reports that she has never smoked. She has never used smokeless tobacco. She reports that she does not drink alcohol and does not use drugs.  Family History: family history includes Autism in her paternal grandfather; Breast cancer in her maternal grandmother; Diabetes in her mother; Hypertension in her father and mother; Seizures in her cousin; Stroke in her cousin.   Review of Systems: A full  review of systems was performed and negative except as noted in the HPI.     O:  BP 130/75 (BP Location: Right Arm)   Pulse (!) 105   Temp 98.6 F (37 C) (Oral)   Resp 16   Ht 5\' 2"  (1.575 m)   Wt 70.3 kg   LMP 03/08/2020   BMI 28.35 kg/m  No results found for this or any previous visit (from the past 48 hour(s)).   Constitutional: NAD, AAOx3  HE/ENT: extraocular movements grossly intact, moist mucous membranes CV: RRR PULM: nl respiratory effort, CTABL     Abd: gravid, non-tender, non-distended, soft      Ext: Non-tender, Nonedematous   Psych: mood appropriate, speech normal Pelvic: deferred  Fetal  monitoring: Cat I Appropriate for GA Baseline: 135bpm Variability: moderate Accelerations: present x >2 Decelerations absent Time 03/10/2020  Bedside to confirm presentation, currently transverse with head to maternal right.     A/P: 25 y.o. [redacted]w[redacted]d here for antenatal surveillance for decreased fetal movement.   Principle Diagnosis:  Decreased fetal movement.    Labor: not present.   Fetal Wellbeing: Reassuring Cat 1 tracing.  Reactive NST   D/c home stable, precautions reviewed, follow-up as scheduled.    [redacted]w[redacted]d, CNM 09/12/2020  2:24 PM

## 2020-11-16 LAB — OB RESULTS CONSOLE RPR: RPR: NONREACTIVE

## 2020-11-16 LAB — OB RESULTS CONSOLE GBS: GBS: NEGATIVE

## 2020-11-16 LAB — OB RESULTS CONSOLE GC/CHLAMYDIA
Chlamydia: NEGATIVE
Gonorrhea: NEGATIVE

## 2020-11-16 LAB — OB RESULTS CONSOLE HIV ANTIBODY (ROUTINE TESTING): HIV: NONREACTIVE

## 2020-12-04 ENCOUNTER — Other Ambulatory Visit: Payer: Self-pay

## 2020-12-04 DIAGNOSIS — Z8669 Personal history of other diseases of the nervous system and sense organs: Secondary | ICD-10-CM | POA: Insufficient documentation

## 2020-12-04 NOTE — Progress Notes (Signed)
[id number] with seizure d/o at [redacted]w[redacted]d, LMP of 03/08/2021, c/w early [place] at [redacted]w[redacted]d.  Scheduled for induction of labor for seizure d/o, on medications on 12/07/2020 at 0500.   Prenatal provider: [facility] Pregnancy complicated by: 1. Seizure d/o - on Lamictal and Keppra   Current meds:  Lamotrigine 200mg  BID   Keppra 500mg  BID  2. Obesity in pregnancy - BMI 38   Recommendations for post delivery changes to medications:   Lamotrigine 100mg  BID   Keppra 500mg  BID   At 2 weeks postpartum decrease Keppra to 250mg  BID   Prenatal Labs: Blood type/Rh A pos   Antibody screen neg  Rubella Immune  Varicella Immune  RPR NR  HBsAg Neg  HIV NR  GC neg  Chlamydia neg  Genetic screening Cf DNA negative; AFP neg   1 hour GTT 118  3 hour GTT N/A   GBS Negative    Tdap: given 09/22/2020 Flu: declined  Contraception: Considering BTL - consent signed 07/27/2020 Feeding preference: considering breast feeding   ____ , CNM Certified Nurse Midwife [facility]

## 2020-12-05 ENCOUNTER — Other Ambulatory Visit: Payer: Self-pay

## 2020-12-05 ENCOUNTER — Inpatient Hospital Stay: Admission: RE | Admit: 2020-12-05 | Payer: Medicaid Other | Source: Ambulatory Visit

## 2020-12-05 ENCOUNTER — Other Ambulatory Visit
Admission: RE | Admit: 2020-12-05 | Discharge: 2020-12-05 | Disposition: A | Discharge status: Home / Self Care | Payer: Medicaid Other | Source: Ambulatory Visit

## 2020-12-05 DIAGNOSIS — Z20822 Contact with and (suspected) exposure to covid-19: Secondary | ICD-10-CM | POA: Insufficient documentation

## 2020-12-05 DIAGNOSIS — Z01812 Encounter for preprocedural laboratory examination: Secondary | ICD-10-CM | POA: Diagnosis present

## 2020-12-05 LAB — SARS CORONAVIRUS 2 (TAT 6-24 HRS): SARS Coronavirus 2: NEGATIVE

## 2020-12-07 ENCOUNTER — Other Ambulatory Visit: Payer: Self-pay

## 2020-12-07 ENCOUNTER — Inpatient Hospital Stay
Admission: EM | Admit: 2020-12-07 | Discharge: 2020-12-09 | DRG: 806 | Disposition: A | Discharge status: Home / Self Care | Payer: Medicaid Other | Attending: Certified Nurse Midwife | Admitting: Certified Nurse Midwife

## 2020-12-07 DIAGNOSIS — G40909 Epilepsy, unspecified, not intractable, without status epilepticus: Secondary | ICD-10-CM | POA: Diagnosis present

## 2020-12-07 DIAGNOSIS — O99214 Obesity complicating childbirth: Secondary | ICD-10-CM | POA: Diagnosis present

## 2020-12-07 DIAGNOSIS — D62 Acute posthemorrhagic anemia: Secondary | ICD-10-CM | POA: Diagnosis not present

## 2020-12-07 DIAGNOSIS — O99354 Diseases of the nervous system complicating childbirth: Principal | ICD-10-CM | POA: Diagnosis present

## 2020-12-07 DIAGNOSIS — O9081 Anemia of the puerperium: Secondary | ICD-10-CM | POA: Diagnosis not present

## 2020-12-07 DIAGNOSIS — Z3A39 39 weeks gestation of pregnancy: Secondary | ICD-10-CM

## 2020-12-07 LAB — CBC
HCT: 32.7 % — ABNORMAL LOW (ref 36.0–46.0)
Hemoglobin: 10.8 g/dL — ABNORMAL LOW (ref 12.0–15.0)
MCH: 26.5 pg (ref 26.0–34.0)
MCHC: 33 g/dL (ref 30.0–36.0)
MCV: 80.1 fL (ref 80.0–100.0)
Platelets: 249 10*3/uL (ref 150–400)
RBC: 4.08 MIL/uL (ref 3.87–5.11)
RDW: 13 % (ref 11.5–15.5)
WBC: 8.7 10*3/uL (ref 4.0–10.5)
nRBC: 0 % (ref 0.0–0.2)

## 2020-12-07 LAB — TYPE AND SCREEN
ABO/RH(D): A POS
Antibody Screen: NEGATIVE

## 2020-12-07 LAB — RPR: RPR Ser Ql: NONREACTIVE

## 2020-12-07 MED ORDER — TERBUTALINE SULFATE 1 MG/ML IJ SOLN: 0.2500 mg | Freq: Once | INTRAMUSCULAR | Status: DC | PRN

## 2020-12-07 MED ORDER — FENTANYL 2.5 MCG/ML W/ROPIVACAINE 0.15% IN NS 100 ML EPIDURAL (ARMC)
EPIDURAL | Status: AC
  Filled 2020-12-07: qty 100

## 2020-12-07 MED ORDER — OXYTOCIN-SODIUM CHLORIDE 30-0.9 UT/500ML-% IV SOLN
1.0000 m[IU]/min | INTRAVENOUS | Status: DC
  Administered 2020-12-07: 2 m[IU]/min via INTRAVENOUS
  Filled 2020-12-07: qty 500

## 2020-12-07 MED ORDER — LACTATED RINGERS IV SOLN: INTRAVENOUS | Status: DC

## 2020-12-07 MED ORDER — OXYTOCIN-SODIUM CHLORIDE 30-0.9 UT/500ML-% IV SOLN
2.5000 [IU]/h | INTRAVENOUS | Status: DC
  Administered 2020-12-08: 2.5 [IU]/h via INTRAVENOUS

## 2020-12-07 MED ORDER — LACTATED RINGERS IV SOLN: 500.0000 mL | INTRAVENOUS | Status: DC | PRN

## 2020-12-07 MED ORDER — LIDOCAINE HCL (PF) 1 % IJ SOLN
INTRAMUSCULAR | Status: AC
  Filled 2020-12-07: qty 30

## 2020-12-07 MED ORDER — ACETAMINOPHEN 500 MG PO TABS
1000.0000 mg | ORAL_TABLET | Freq: Four times a day (QID) | ORAL | Status: DC | PRN
Start: 2020-12-07 — End: 2020-12-08

## 2020-12-07 MED ORDER — SOD CITRATE-CITRIC ACID 500-334 MG/5ML PO SOLN: 30.0000 mL | ORAL | Status: DC | PRN

## 2020-12-07 MED ORDER — FENTANYL CITRATE (PF) 100 MCG/2ML IJ SOLN: 50.0000 ug | INTRAMUSCULAR | Status: DC | PRN

## 2020-12-07 MED ORDER — AMMONIA AROMATIC IN INHA
RESPIRATORY_TRACT | Status: AC
  Filled 2020-12-07: qty 10

## 2020-12-07 MED ORDER — LIDOCAINE HCL (PF) 1 % IJ SOLN: 30.0000 mL | INTRAMUSCULAR | Status: DC | PRN

## 2020-12-07 MED ORDER — OXYTOCIN BOLUS FROM INFUSION
333.0000 mL | Freq: Once | INTRAVENOUS | Status: AC
  Administered 2020-12-08: 333 mL via INTRAVENOUS

## 2020-12-07 MED ORDER — LAMOTRIGINE 100 MG PO TABS
200.0000 mg | ORAL_TABLET | Freq: Two times a day (BID) | ORAL | Status: DC
  Administered 2020-12-07 – 2020-12-09 (×3): 200 mg via ORAL
  Filled 2020-12-07 (×6): qty 2

## 2020-12-07 MED ORDER — MISOPROSTOL 200 MCG PO TABS
ORAL_TABLET | ORAL | Status: AC
  Administered 2020-12-07: 25 ug via VAGINAL
  Filled 2020-12-07: qty 4

## 2020-12-07 MED ORDER — CALCIUM CARBONATE ANTACID 500 MG PO CHEW: 400.0000 mg | CHEWABLE_TABLET | Freq: Three times a day (TID) | ORAL | Status: DC | PRN

## 2020-12-07 MED ORDER — LEVETIRACETAM 500 MG PO TABS
500.0000 mg | ORAL_TABLET | Freq: Two times a day (BID) | ORAL | Status: DC
  Administered 2020-12-07 – 2020-12-09 (×3): 500 mg via ORAL
  Filled 2020-12-07 (×6): qty 1

## 2020-12-07 MED ORDER — MISOPROSTOL 25 MCG QUARTER TABLET
25.0000 ug | ORAL_TABLET | ORAL | Status: DC | PRN
  Administered 2020-12-07 (×2): 25 ug via VAGINAL
  Filled 2020-12-07 (×5): qty 1

## 2020-12-07 MED ORDER — OXYTOCIN 10 UNIT/ML IJ SOLN
INTRAMUSCULAR | Status: AC
  Filled 2020-12-07: qty 2

## 2020-12-07 MED ORDER — ONDANSETRON HCL 4 MG/2ML IJ SOLN: 4.0000 mg | Freq: Four times a day (QID) | INTRAMUSCULAR | Status: DC | PRN

## 2020-12-07 MED ORDER — MISOPROSTOL 25 MCG QUARTER TABLET
25.0000 ug | ORAL_TABLET | ORAL | Status: DC | PRN
  Administered 2020-12-07 (×2): 25 ug via BUCCAL
  Filled 2020-12-07 (×3): qty 1

## 2020-12-07 NOTE — H&P (Signed)
OB History & Physical   History of Present Illness:  Chief Complaint:   HPI:  Kathleen Mcgrath is a 25 y.o. [phone] female at [redacted]w[redacted]d dated by LMP.  She presents to L&D for scheduled for induction of labor for seizure d/o, on medications on 12/07/2020 at 0500  She reports:  -active fetal movement -no leakage of fluid -no vaginal bleeding -no contractions  Pregnancy Issues: 1. 1. Seizure d/o - on Lamictal and Keppra   Current meds: ? Lamotrigine 200mg  BID  ? Keppra 500mg  BID  2. Obesity in pregnancy - BMI 38   Recommendations for post delivery changes to medications:   Lamotrigine 100mg  BID   Keppra 500mg  BID   At 2 weeks postpartum decrease Keppra to 250mg  BID     Maternal Medical History:   Past Medical History:  Diagnosis Date  . Seizures (HCC)   . Seizures (HCC)     Past Surgical History:  Procedure Laterality Date  . NO PAST SURGERIES      Allergies  Allergen Reactions  . Phenytoin Anaphylaxis and Itching    Prior to Admission medications   Medication Sig Start Date End Date Taking? Authorizing Provider  lamoTRIgine (LAMICTAL) 200 MG tablet Take 200 mg by mouth 2 (two) times daily.   Yes [provider]  levETIRAcetam (KEPPRA) 250 MG tablet Take 500 mg by mouth 2 (two) times daily.   Yes [provider]  folic acid (FOLVITE) 1 MG tablet Take 1 mg by mouth daily. Patient not taking: Reported on 12/07/2020 05/04/20 05/04/21  [provider]  Prenatal MV-Min-Fe Fum-FA-DHA (PRENATAL 1 PO) Take 1 tablet by mouth daily. Reported on 12/26/2015 Patient not taking: Reported on 09/12/2020    [provider]     Prenatal care site: [facility]  Social History: She  reports that she has never smoked. She has never used smokeless tobacco. She reports that she does not drink alcohol and does not use drugs.  Family History: family history includes Autism in her paternal grandfather; Breast cancer in her maternal grandmother;  Diabetes in her mother; Hypertension in her father and mother; Seizures in her cousin; Stroke in her cousin.   Review of Systems: A full review of systems was performed and negative except as noted in the HPI.    Physical Exam:  Vital Signs: BP 132/74 (BP Location: Left Arm)   Pulse 88   Temp 98.3 F (36.8 C) (Oral)   Resp 16   Ht 5\' 2"  (1.575 m)   Wt 95.3 kg   LMP 03/08/2020   BMI 38.41 kg/m   General:   alert and cooperative  Skin:  normal  Neurologic:    Alert & oriented x 3  Lungs:   nl effort  Heart:   regular rate and rhythm  Abdomen:  soft, non-tender; bowel sounds normal; no masses,  no organomegaly  Extremities: : non-tender, symmetric, no edema bilaterally.      EFW: 11/22/20: 6lb5oz(2864g)= 30%  Results for orders placed or performed during the hospital encounter of 12/07/20 (from the past 24 hour(s))  CBC     Status: Abnormal   Collection Time: 12/07/20  5:51 AM  Result Value Ref Range   WBC 8.7 4.0 - 10.5 K/uL   RBC 4.08 3.87 - 5.11 MIL/uL   Hemoglobin 10.8 (L) 12.0 - 15.0 g/dL   HCT [facility] (L) - 03/10/2020 %   MCV 80.1 80.0 - 100.0 fL   MCH 26.5 26.0 - 34.0 pg  MCHC 33.0 30.0 - 36.0 g/dL   RDW 47.0 96.2 - 83.6 %   Platelets 249 150 - 400 K/uL   nRBC 0.0 0.0 - 0.2 %  Type and screen     Status: None   Collection Time: 12/07/20  5:51 AM  Result Value Ref Range   ABO/RH(D) A POS    Antibody Screen NEG    Sample Expiration      12/10/2020,2359 Performed at [facility], [address], [place], [place] [address]     Pertinent Results:  Prenatal Labs: Blood type/Rh A pos   Antibody screen neg  Rubella Immune  Varicella Immune  RPR NR  HBsAg Neg  HIV NR  GC neg  Chlamydia neg  Genetic screening Cf DNA negative; AFP neg   1 hour GTT 118  3 hour GTT N/A   GBS Negative   FHT: FHR: 130 bpm, variability: moderate,  accelerations:  Present,  decelerations:  Absent Category/reactivity:  Category I TOCO: irregular, mild SVE: Dilation: 1 /  Effacement (%): 20 / Station: Ballotable     Assessment:  Kathleen Mcgrath is a 25 y.o. [id number] female at [redacted]w[redacted]d with seizure disorder.   Plan:  1. Admit to Labor & Delivery; consents reviewed and obtained  2. Fetal Well being  - Fetal Tracing: Cat I - GBS neg - Presentation: vtx confirmed by SVE   3. Routine OB: - Prenatal labs reviewed, as above - Rh pos - CBC & T&S on admit - Clear fluids, IVF  4. Induction of Labor -  Contractions by external toco in place -  Pelvis proven to 6lb 13oz -  Plan for induction with Cytotec -  Plan for continuous fetal monitoring  -  Maternal pain control as desired: IVPM, nitrous, regional anesthesia - Anticipate vaginal delivery  5. Post Partum Planning: - Infant feeding: considering BFing - Contraception: Considering BTL - consent signed 07/27/2020 - Tdap: given 09/22/2020 - Flu: declined   Dawit Tankard, CNM 12/07/2020 8:58 AM

## 2020-12-07 NOTE — Progress Notes (Signed)
Labor Progress Note  Kathleen Mcgrath is a 25 y.o. [id number] at [redacted]w[redacted]d by LMP admitted for induction of labor due to Seizure diagnosis.  Subjective: reports mild discomfort with some contractions. Partner at bedside  Objective: BP 140/77 (BP Location: Left Arm)   Pulse 96   Temp 98.3 F (36.8 C) (Oral)   Resp 16   Ht 5\' 2"  (1.575 m)   Wt 95.3 kg   LMP 03/08/2020   BMI 38.41 kg/m    Fetal Assessment: FHT:  FHR: 130 bpm, variability: moderate,  accelerations:  Present,  decelerations:  Absent Category/reactivity:  Category I UC:   irregular SVE:  deferred  Membrane status:intact Amniotic color: N/A  Labs: Lab Results  Component Value Date   WBC 8.7 12/07/2020   HGB 10.8 (L) 12/07/2020   HCT 32.7 (L) 12/07/2020   MCV 80.1 12/07/2020   PLT 249 12/07/2020    Assessment / Plan: Induction of labor due to seizure diagnosis, s/p Cytotec x 2  Labor: Progressing normally Preeclampsia:  140/77- will continue to monitor bp's Fetal Wellbeing:  Category I Pain Control:  Labor support without medications I/D:  afebrile, GBS neg, intact Anticipated MOD:  NSVD  Jenifer E Shadai Mcclane, CNM 12/07/2020, 1:28 PM

## 2020-12-08 ENCOUNTER — Inpatient Hospital Stay: Payer: Medicaid Other | Admitting: Anesthesiology

## 2020-12-08 LAB — CBC
HCT: 30.8 % — ABNORMAL LOW (ref 36.0–46.0)
Hemoglobin: 10.2 g/dL — ABNORMAL LOW (ref 12.0–15.0)
MCH: 27.2 pg (ref 26.0–34.0)
MCHC: 33.1 g/dL (ref 30.0–36.0)
MCV: 82.1 fL (ref 80.0–100.0)
Platelets: 206 10*3/uL (ref 150–400)
RBC: 3.75 MIL/uL — ABNORMAL LOW (ref 3.87–5.11)
RDW: 13.2 % (ref 11.5–15.5)
WBC: 11.6 10*3/uL — ABNORMAL HIGH (ref 4.0–10.5)
nRBC: 0 % (ref 0.0–0.2)

## 2020-12-08 MED ORDER — WITCH HAZEL-GLYCERIN EX PADS: 1.0000 "application " | MEDICATED_PAD | CUTANEOUS | Status: DC | PRN

## 2020-12-08 MED ORDER — METHYLERGONOVINE MALEATE 0.2 MG/ML IJ SOLN: 0.2000 mg | INTRAMUSCULAR | Status: DC | PRN

## 2020-12-08 MED ORDER — ONDANSETRON HCL 4 MG PO TABS: 4.0000 mg | ORAL_TABLET | ORAL | Status: DC | PRN

## 2020-12-08 MED ORDER — ACETAMINOPHEN 325 MG PO TABS
650.0000 mg | ORAL_TABLET | ORAL | Status: DC | PRN
  Administered 2020-12-08 – 2020-12-09 (×3): 650 mg via ORAL
  Filled 2020-12-08 (×3): qty 2

## 2020-12-08 MED ORDER — DOCUSATE SODIUM 100 MG PO CAPS
100.0000 mg | ORAL_CAPSULE | Freq: Two times a day (BID) | ORAL | Status: DC
  Filled 2020-12-08: qty 1

## 2020-12-08 MED ORDER — PHENYLEPHRINE 40 MCG/ML (10ML) SYRINGE FOR IV PUSH (FOR BLOOD PRESSURE SUPPORT)
80.0000 ug | PREFILLED_SYRINGE | INTRAVENOUS | Status: DC | PRN
  Filled 2020-12-08: qty 10

## 2020-12-08 MED ORDER — LACTATED RINGERS IV SOLN
500.0000 mL | Freq: Once | INTRAVENOUS | Status: AC
  Administered 2020-12-08: 500 mL via INTRAVENOUS

## 2020-12-08 MED ORDER — FENTANYL 2.5 MCG/ML W/ROPIVACAINE 0.15% IN NS 100 ML EPIDURAL (ARMC)
EPIDURAL | Status: DC | PRN
  Administered 2020-12-08: 12 mL/h via EPIDURAL

## 2020-12-08 MED ORDER — LIDOCAINE-EPINEPHRINE (PF) 1.5 %-1:200000 IJ SOLN
INTRAMUSCULAR | Status: DC | PRN
  Administered 2020-12-08: 3 mL via EPIDURAL

## 2020-12-08 MED ORDER — DIPHENHYDRAMINE HCL 25 MG PO CAPS: 25.0000 mg | ORAL_CAPSULE | Freq: Four times a day (QID) | ORAL | Status: DC | PRN

## 2020-12-08 MED ORDER — OXYTOCIN-SODIUM CHLORIDE 30-0.9 UT/500ML-% IV SOLN
INTRAVENOUS | Status: AC
  Filled 2020-12-08: qty 500

## 2020-12-08 MED ORDER — FERROUS SULFATE 325 (65 FE) MG PO TABS
325.0000 mg | ORAL_TABLET | Freq: Two times a day (BID) | ORAL | Status: DC
  Filled 2020-12-08 (×2): qty 1

## 2020-12-08 MED ORDER — EPHEDRINE 5 MG/ML INJ
10.0000 mg | INTRAVENOUS | Status: DC | PRN
  Filled 2020-12-08: qty 2

## 2020-12-08 MED ORDER — OXYCODONE HCL 5 MG PO TABS: 5.0000 mg | ORAL_TABLET | ORAL | Status: DC | PRN

## 2020-12-08 MED ORDER — SIMETHICONE 80 MG PO CHEW: 80.0000 mg | CHEWABLE_TABLET | ORAL | Status: DC | PRN

## 2020-12-08 MED ORDER — DIBUCAINE (PERIANAL) 1 % EX OINT: 1.0000 "application " | TOPICAL_OINTMENT | CUTANEOUS | Status: DC | PRN

## 2020-12-08 MED ORDER — TETANUS-DIPHTH-ACELL PERTUSSIS 5-2.5-18.5 LF-MCG/0.5 IM SUSY: 0.5000 mL | PREFILLED_SYRINGE | Freq: Once | INTRAMUSCULAR | Status: DC

## 2020-12-08 MED ORDER — LIDOCAINE HCL (PF) 1 % IJ SOLN
INTRAMUSCULAR | Status: DC | PRN
  Administered 2020-12-08: 1.5 mL via SUBCUTANEOUS

## 2020-12-08 MED ORDER — ONDANSETRON HCL 4 MG/2ML IJ SOLN: 4.0000 mg | INTRAMUSCULAR | Status: DC | PRN

## 2020-12-08 MED ORDER — PRENATAL MULTIVITAMIN CH
1.0000 | ORAL_TABLET | Freq: Every day | ORAL | Status: DC
  Filled 2020-12-08: qty 1

## 2020-12-08 MED ORDER — COCONUT OIL OIL: 1.0000 "application " | TOPICAL_OIL | Status: DC | PRN

## 2020-12-08 MED ORDER — FENTANYL 2.5 MCG/ML W/ROPIVACAINE 0.15% IN NS 100 ML EPIDURAL (ARMC): 12.0000 mL/h | EPIDURAL | Status: DC

## 2020-12-08 MED ORDER — OXYCODONE HCL 5 MG PO TABS: 10.0000 mg | ORAL_TABLET | ORAL | Status: DC | PRN

## 2020-12-08 MED ORDER — IBUPROFEN 600 MG PO TABS
600.0000 mg | ORAL_TABLET | Freq: Four times a day (QID) | ORAL | Status: DC
  Administered 2020-12-08 – 2020-12-09 (×3): 600 mg via ORAL
  Filled 2020-12-08 (×4): qty 1

## 2020-12-08 MED ORDER — BENZOCAINE-MENTHOL 20-0.5 % EX AERO
1.0000 "application " | INHALATION_SPRAY | CUTANEOUS | Status: DC | PRN
  Administered 2020-12-09: 1 via TOPICAL
  Filled 2020-12-08: qty 56

## 2020-12-08 MED ORDER — DIPHENHYDRAMINE HCL 50 MG/ML IJ SOLN: 12.5000 mg | INTRAMUSCULAR | Status: DC | PRN

## 2020-12-08 MED ORDER — METHYLERGONOVINE MALEATE 0.2 MG PO TABS: 0.2000 mg | ORAL_TABLET | ORAL | Status: DC | PRN

## 2020-12-08 NOTE — Lactation Note (Signed)
This note was copied from a baby's chart. Lactation Consultation Note  Patient Name: Kathleen Mcgrath STMHD'Q Date: 12/08/2020 Reason for consult: Follow-up assessment;Term Age:27 hours  Lactation at bedside to assist with feeding attempt. Baby received bath earlier, and now has her newborn gown on. Mom wants to try feeding since baby is somewhat alert.  Mom has been feeling uncomfortable with positioning for feeding on L breast. LC encourages efforts on L breast this time. Mom does not remove clothing fully, and did not desire for infant to remove clothing. With LC's assist baby was brought to breast and attempted to latch w/o shield- hand expressing to tempt baby. Baby pushing away not showing interest. Nipple shield applied- 24 does seem large, but mom states this was more comfortable than the other size- Again baby brought to breast, but showing no interest or desire in feeding.  LC reviewed with mom and provided reassurance on feeding patterns for first 24 hours. Encouraged hand expression and spoon feeding if baby is uninterested with feedings, but discussed potential cluster feeding overnight.  Mom had questions re: pacifier and pump/bottle. LC discussed impact that early introduction can have on building a milk supply due to masking early hunger cues. Mom verbalized understanding. Praised mom for wanting to breastfeed, and for good questions. Encouraged to call out for support as needed.  Maternal Data Has patient been taught Hand Expression?: Yes  Feeding Mother's Current Feeding Choice: Breast Milk  LATCH Score Latch: Too sleepy or reluctant, no latch achieved, no sucking elicited.                  Lactation Tools Discussed/Used Tools: Nipple Shields Nipple shield size: 24 (seems large but mom is comfortable)  Interventions Interventions: Breast feeding basics reviewed;Assisted with latch;Hand express;Adjust position;Support pillows;Position  options;Education  Discharge    Consult Status Consult Status: Follow-up Date: 12/09/20 Follow-up type: In-patient    Danford Bad 12/08/2020, 3:53 PM

## 2020-12-08 NOTE — Anesthesia Preprocedure Evaluation (Signed)
Anesthesia Evaluation  Patient identified by MRN, date of birth, ID band Patient awake    Reviewed: Allergy & Precautions, NPO status , Patient's Chart, lab work & pertinent test results  History of Anesthesia Complications Negative for: history of anesthetic complications  Airway Mallampati: II       Dental   Pulmonary neg sleep apnea, neg COPD, Not current smoker,           Cardiovascular (-) hypertension(-) Past MI and (-) CHF (-) dysrhythmias (-) Valvular Problems/Murmurs     Neuro/Psych Seizures - (Last siezure > 4 yrs ago), Well Controlled,     GI/Hepatic Neg liver ROS, GERD (with pregnancy)  ,  Endo/Other  neg diabetes  Renal/GU negative Renal ROS     Musculoskeletal   Abdominal   Peds  Hematology   Anesthesia Other Findings   Reproductive/Obstetrics (+) Pregnancy                             Anesthesia Physical Anesthesia Plan  ASA: II  Anesthesia Plan: Epidural   Post-op Pain Management:    Induction:   PONV Risk Score and Plan:   Airway Management Planned:   Additional Equipment:   Intra-op Plan:   Post-operative Plan:   Informed Consent: I have reviewed the patients History and Physical, chart, labs and discussed the procedure including the risks, benefits and alternatives for the proposed anesthesia with the patient or authorized representative who has indicated his/her understanding and acceptance.       Plan Discussed with:   Anesthesia Plan Comments:         Anesthesia Quick Evaluation

## 2020-12-08 NOTE — Discharge Summary (Signed)
Obstetrical Discharge Summary  Patient Name: Kathleen Mcgrath DOB: 1996-03-14 MRN: [id number]  Date of Admission: 12/07/2020 Date of Delivery: 12/08/20 Delivered by: Annamary Rummage CNM Date of Discharge: 12/09/2020  Primary OB: [facility]  [id number] last menstrual period was 03/08/2020. EDC Estimated Date of Delivery: 12/13/20 Gestational Age at Delivery: [redacted]w[redacted]d   Antepartum complications:  1. Seizure d/o - on Lamictal and Keppra  2. Obesity in pregnancy - BMI 38  Admitting Diagnosis: IOL for seizure disorder Secondary Diagnosis: Patient Active Problem List   Diagnosis Date Noted  . NSVD (normal spontaneous vaginal delivery) 12/08/2020  . Hx of seizure disorder 12/04/2020  . Supervision of high risk pregnancy in second trimester 05/04/2020  . 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 [id number] at [redacted]w[redacted]d presenting for IOL, SROM with clear fluid.  She progressed to complete and pushed over an intact perineum and delivered the fetal head, followed promptly by the shoulders. She was in control the whole time, and the baby placed on the maternal abdomen. Delayed cord clamping and the FOB cut the baby's cord, while the baby was skin to skin. The placenta delivered spontaneously and intact. No lacerations. Mom and baby tolerated the procedure well.   Postpartum Procedures: None  Post partum course:  Patient had an uncomplicated postpartum course.  By time of discharge on PPD#1, her pain was controlled on oral pain medications; she had appropriate lochia and was  ambulating, voiding without difficulty and tolerating regular diet.  She was deemed stable for discharge to home.    Discharge Physical Exam:  BP 122/80 (BP Location: Left Arm)   Pulse 82   Temp 98.3 F (36.8 C) (Oral)   Resp 18   Ht 5\' 2"  (1.575 m)   Wt 95.3 kg   LMP 03/08/2020   SpO2 100%   Breastfeeding Unknown   BMI 38.41 kg/m   General: alert and no distress Pulm: normal respiratory effort Lochia: appropriate Abdomen: soft, NT Uterine Fundus: firm, below umbilicus Extremities: No evidence of DVT seen on physical exam. No lower extremity edema. Edinburgh:  Edinburgh Postnatal Depression Scale Screening Tool 12/08/2020 12/08/2020  I have been able to laugh and see the funny side of things. 0 (No Data)  I have looked forward with enjoyment to things. 0 -  I have blamed myself unnecessarily when things went wrong. 1 -  I have been anxious or worried for no good reason. 2 -  I have felt scared or panicky for no good reason. 0 -  Things have been getting on top of me. 1 -  I have been so unhappy that I have had difficulty sleeping. 0 -  I have felt sad or miserable. 0 -  I have been so unhappy that I have been crying. 0 -  The thought of harming myself has occurred to me. 0 -  Edinburgh Postnatal Depression Scale Total 4 -     Labs: CBC Latest Ref Rng & Units 12/08/2020 12/07/2020 11/05/2019  WBC 4.0 - 10.5 K/uL 11.6(H) 8.7 9.8  Hemoglobin 12.0 - 15.0 g/dL 10.2(L) 10.8(L) 12.5  Hematocrit 36.0 - 46.0 % 30.8(L) 32.7(L) 38.8  Platelets 150 - 400 K/uL 206 249 344   A POS Hemoglobin  Date Value Ref Range Status  12/08/2020 10.2 (L) 12.0 - 15.0 g/dL Final  91/50/5697 94.8 11.1 - 15.9 g/dL Final   HCT  Date Value Ref Range Status  12/08/2020 30.8 (L) 36.0 - 46.0 % Final   Hematocrit  Date Value Ref Range Status  10/13/2015 33.8 (L) 34.0 - 46.6 % Final    Disposition: stable, discharge to home Baby Feeding: breastmilk Baby Disposition: home with mom  Contraception:  Considering BTL - consent signed 07/27/2020  Prenatal Labs:  Blood type/Rh A pos  Antibody screen neg  Rubella Immune  Varicella Immune  RPR NR  HBsAg Neg  HIV NR  GC neg  Chlamydia neg  Genetic screening Cf DNAnegative; AFP neg  1 hour GTT 118  3 hour GTT N/A  GBS Negative   Rh Immune globulin given: n/a Rubella vaccine given: Immune Varicella vaccine given: Immune Tdap vaccine given in AP or PP setting: 09/22/20 Flu vaccine given in AP or PP setting: declined  Plan: Kathleen Mcgrath was discharged to home in good condition. Follow-up appointment with delivering provider in 6 weeks.  Discharge Instructions: Per After Visit Summary. Activity: Advance as tolerated. Pelvic rest for 6 weeks.   Diet: Regular Discharge Medications: Allergies as of 12/09/2020      Reactions   Phenytoin Anaphylaxis, Itching      Medication List    TAKE these medications   folic acid 1 MG tablet Commonly known as: FOLVITE Take 1 mg by mouth daily.   lamoTRIgine 100 MG tablet Commonly known as: LaMICtal Take 1 tablet (100 mg total) by mouth 2 (two) times daily. What changed:   medication strength  how much to take   levETIRAcetam 500 MG tablet Commonly known as: Keppra Take 1 tablet (500 mg total) by mouth 2 (two) times daily. What changed: medication strength   PRENATAL 1 PO Take 1 tablet by mouth daily. Reported on 12/26/2015      Outpatient follow up:   Follow-up Information    Haroldine Laws, CNM. Schedule an appointment as soon as possible for a visit in 6 week(s).   Specialty: Certified Nurse Midwife Why: Remember!!!!   2 weeks post delivery it is okay to decrease Keppra to 250 mg twice daily. Contact information: [address] [place] [address] [phone]               Signed: Quillian Quince 12/09/2020 8:39 AM

## 2020-12-08 NOTE — Progress Notes (Signed)
Post Partum Day 0  Subjective: Doing well, no complaints.  Tolerating regular diet, pain with PO meds, voiding and ambulating without difficulty.  No CP SOB Fever,Chills, N/V or leg pain; denies nipple or breast pain, no HA change of vision, RUQ/epigastric pain  Objective: BP 120/80 (BP Location: Left Arm)   Pulse 81   Temp 97.6 F (36.4 C) (Oral)   Resp 18   Ht 5\' 2"  (1.575 m)   Wt 95.3 kg   LMP 03/08/2020   SpO2 100%   BMI 38.41 kg/m    Physical Exam:  General: NAD Breasts: soft/nontender CV: RRR Pulm: nl effort, CTABL Abdomen: soft, NT, BS x 4 Perineum: minimal edema, intact Lochia: small Uterine Fundus: fundus firm and 2 fb below umbilicus DVT Evaluation: no cords, ttp LEs   Recent Labs    12/07/20 0551 12/08/20 0624  HGB 10.8* 10.2*  HCT 32.7* 30.8*  WBC 8.7 11.6*  PLT 249 206    Assessment/Plan: 25 y.o. 34 postpartum day # 0  - Continue routine PP care - Lactation consult prn.  - Discussed contraceptive options including implant, IUDs hormonal and non-hormonal, injection, pills/ring/patch, condoms, and NFP.  - Acute blood loss anemia - hemodynamically stable and asymptomatic; start po ferrous sulfate BID with stool softeners  - Immunization status:  all Imms up to date   Disposition: Does not desire Dc home today.    [id number], CNM 12/08/2020  6:02 PM

## 2020-12-08 NOTE — Anesthesia Procedure Notes (Signed)
Epidural Patient location during procedure: OB Start time: 12/08/2020 12:15 AM End time: 12/08/2020 12:39 AM  Staffing Performed: anesthesiologist   Preanesthetic Checklist Completed: patient identified, IV checked, site marked, risks and benefits discussed, surgical consent, monitors and equipment checked, pre-op evaluation and timeout performed  Epidural Patient position: sitting Prep: Betadine Patient monitoring: heart rate, continuous pulse ox and blood pressure Approach: midline Location: L4-L5 Injection technique: LOR saline  Needle:  Needle type: Tuohy  Needle gauge: 17 G Needle length: 9 cm and 9 Needle insertion depth: 8 cm Catheter type: closed end flexible Catheter size: 20 Guage Catheter at skin depth: 13 cm Test dose: negative and 1.5% lidocaine with Epi 1:200 K  Assessment Events: blood not aspirated, injection not painful, no injection resistance, no paresthesia and negative IV test  Additional Notes   Patient tolerated the insertion well without complications.Reason for block:procedure for pain

## 2020-12-08 NOTE — Lactation Note (Addendum)
This note was copied from a baby's chart. Lactation Consultation Note  Patient Name: Kathleen Mcgrath SKAJG'O Date: 12/08/2020 Reason for consult: Initial assessment;Term (nipple shield) Age:27 hours  Initial lactation visit. Mom is G4P3 SVD 7 hours ago. RN's report difficulty with latching post delivery, nipple shield given and a feeding was successful- duration not documented in Epic yet.  Mom reports BF her first child for 1 month, cites low supply and didn't feel like it worked best for her as main reasons for early discontinuation. Mom did not BF her second child. Desires BF with this child, however desires options for pumping and bottle feeding as well.  Mom has been diagnosed with a seizure disorder and is taking 2 rx: Keppra and Lamicatal- both are L2 and compatible with breastfeeding.  LC reviewed newborn stomach sizes and feeding patterns in first 24 hours, along with output expectations. Reviewed early hunger cues, feeding with cues, and benefits of skin to skin for both baby and mom. LC encouraged hand expression/spoon feeding if baby seems sleepy or uninterested during feeding attempts. When offered, mom declined for [facility] to demonstrate hand expression at this time. LC encouraged full BF for first 2 weeks before implementation of pumping/bottle feeding if possible.  Encouraged mom to call with next feeding attempt for assistance with nipple shield and feeding observation. Whiteboard updated with [facility] name and number.  Maternal Data Has patient been taught Hand Expression?: Yes (per client) Does the patient have breastfeeding experience prior to this delivery?: Yes How long did the patient breastfeed?: 1 month  Feeding Mother's Current Feeding Choice: Breast Milk  LATCH Score Latch: Repeated attempts needed to sustain latch, nipple held in mouth throughout feeding, stimulation needed to elicit sucking reflex.  Audible Swallowing: A few with stimulation  Type of Nipple:  Flat  Comfort (Breast/Nipple): Soft / non-tender  Hold (Positioning): Assistance needed to correctly position infant at breast and maintain latch.  LATCH Score: 6   Lactation Tools Discussed/Used Tools: Nipple Dorris Carnes (given after delivery)  Interventions Interventions: Breast feeding basics reviewed;Education  Discharge    Consult Status Consult Status: Follow-up Date: 12/08/20 Follow-up type: In-patient    Danford Bad 12/08/2020, 9:35 AM

## 2020-12-09 MED ORDER — LEVETIRACETAM 500 MG PO TABS: 500.0000 mg | ORAL_TABLET | Freq: Two times a day (BID) | ORAL | 11 refills | Status: DC

## 2020-12-09 MED ORDER — LAMOTRIGINE 100 MG PO TABS: 100.0000 mg | ORAL_TABLET | Freq: Two times a day (BID) | ORAL | 11 refills | Status: DC

## 2020-12-09 MED ORDER — IBUPROFEN 600 MG PO TABS
600.0000 mg | ORAL_TABLET | Freq: Four times a day (QID) | ORAL | Status: DC
  Administered 2020-12-09: 600 mg via ORAL
  Filled 2020-12-09: qty 1

## 2020-12-09 NOTE — Lactation Note (Signed)
This note was copied from a baby's chart. Lactation Consultation Note  Patient Name: Kathleen Mcgrath [id number] Date: 12/09/2020 Reason for consult: Follow-up assessment;Term Age:27 hours  Maternal Data Has patient been taught Hand Expression?: Yes Does the patient have breastfeeding experience prior to this delivery?: Yes How long did the patient breastfeed?: 1 mth  Feeding Mother's Current Feeding Choice: Breast Milk and Donor Milk Mom had been using a nipple shield, mom placed in right side lying position, attempted without shield and baby able to latch easily in this position as the baby had been pushing away, nursing well with occ. Swallow noted, when baby comes off breast , she is able to relatch well, mom shown how to shape breast to get a deep latch, mom encouraged to offer left breast after baby comes off right, encouraged frequent feedings   LATCH Score Latch: Grasps breast easily, tongue down, lips flanged, rhythmical sucking.  Audible Swallowing: A few with stimulation  Type of Nipple: Everted at rest and after stimulation  Comfort (Breast/Nipple): Soft / non-tender  Hold (Positioning): Assistance needed to correctly position infant at breast and maintain latch.  LATCH Score: 8   Lactation Tools Discussed/Used  LC name and no written on white board   Interventions Interventions: Breast feeding basics reviewed;Assisted with latch;Skin to skin;Hand express;Adjust position;Education  Discharge Discharge Education: Engorgement and breast care Pump: Personal WIC Program: Yes  Consult Status Consult Status: PRN Date: 12/09/20 Follow-up type: In-patient    Dyann Kief 12/09/2020, 12:13 PM

## 2020-12-09 NOTE — Anesthesia Postprocedure Evaluation (Signed)
Anesthesia Post Note  Patient: Kathleen Mcgrath  Procedure(s) Performed: AN AD HOC LABOR EPIDURAL  Patient location during evaluation: Mother Baby Anesthesia Type: Epidural Level of consciousness: awake and alert Pain management: pain level controlled Vital Signs Assessment: post-procedure vital signs reviewed and stable Respiratory status: spontaneous breathing, nonlabored ventilation and respiratory function stable Cardiovascular status: stable Postop Assessment: no headache, no backache and epidural receding Anesthetic complications: no   No complications documented.   Last Vitals:  Vitals:   12/08/20 2338 12/09/20 0747  BP: 125/89 122/80  Pulse: 92 82  Resp: 20 18  Temp: 36.7 C 36.8 C  SpO2: 98% 100%    Last Pain:  Vitals:   12/09/20 0747  TempSrc: Oral  PainSc:                  Jules Schick

## 2020-12-09 NOTE — Progress Notes (Signed)
Patient discharged home with infant. FOB present at discharge. Discharge instructions and prescriptions given and reviewed with patient. Patient verbalized understanding.   Follow-up appointment scheduled for Wednesday, July 6th at 9:45am with Haroldine Laws, CNM at [facility]!   Escorted out by volunteers.

## 2020-12-09 NOTE — Discharge Instructions (Signed)
Remember:  2 weeks post delivery it is okay to decrease Keppra to 250 mg twice daily   Discharge Instructions:   Follow-up Appointment: Wednesday, July 6th at 9:45am with Haroldine Laws, CNM at [facility]!   If there are any new medications, they have been ordered and will be available for pickup at the listed pharmacy on your way home from the hospital.   Call office if you have any of the following: headache, visual changes, fever >101.0 F, chills, shortness of breath, breast concerns, excessive vaginal bleeding, incision drainage or problems, leg pain or redness, depression or any other concerns. If you have vaginal discharge with an odor, let your doctor know.   It is normal to bleed for up to 6 weeks. You should not soak through more than 1 pad in 1 hour. If you have a blood clot larger than your fist with continued bleeding, call your doctor.   Activity: Do not lift > 10 lbs for 6 weeks (do not lift anything heavier than your baby). No intercourse, tampons, swimming pools, hot tubs, baths (only showers) for 6 weeks.  No driving for 1-2 weeks. Continue prenatal vitamin, especially if breastfeeding. Increase calories and fluids (water) while breastfeeding.   Your milk will come in, in the next couple of days (right now it is colostrum). You may have a slight fever when your milk comes in, but it should go away on its own.  If it does not, and rises above 101 F please call the doctor. You will also feel achy and your breasts will be firm. They will also start to leak. If you are breastfeeding, continue as you have been and you can pump/express milk for comfort.   If you have too much milk, your breasts can become engorged, which could lead to mastitis. This is an infection of the milk ducts. It can be very painful and you will need to notify your doctor to obtain a prescription for antibiotics. You can also treat it with a shower or hot/cold compress.   For concerns about your baby,  please call your pediatrician.  For breastfeeding concerns, the lactation consultant can be reached at [phone].   Postpartum blues (feelings of happy one minute and sad another minute) are normal for the first few weeks but if it gets worse let your doctor know.   Congratulations! We enjoyed caring for you and your new bundle of joy!         Postpartum Care After Vaginal Delivery The following information offers guidance about how to care for yourself from the time you deliver your baby to 6-12 weeks after delivery (postpartum period). If you have problems or questions, contact your health care provider for more specific instructions. Follow these instructions at home: Vaginal bleeding  It is normal to have vaginal bleeding (lochia) after delivery. Wear a sanitary pad for bleeding and discharge. ? During the first week after delivery, the amount and appearance of lochia is often similar to a menstrual period. ? Over the next few weeks, it will gradually decrease to a dry, yellow-brown discharge. ? For most women, lochia stops completely by 4-6 weeks after delivery, but can vary.  Change your sanitary pads frequently. Watch for any changes in your flow, such as: ? A sudden increase in volume. ? A change in color. ? Large blood clots.  If you pass a blood clot from your vagina, save it and call your health care provider. Do not flush blood clots down the  toilet before talking with your health care provider.  Do not use tampons or douches until your health care provider approves.  If you are not breastfeeding, your period should return 6-8 weeks after delivery. If you are feeding your baby breast milk only, your period may not return until you stop breastfeeding. Perineal care  Keep the area between the vagina and the anus (perineum) clean and dry. Use medicated pads and pain-relieving sprays and creams as directed.  If you had a surgical cut in the perineum (episiotomy) or a  tear, check the area for signs of infection until you are healed. Check for: ? More redness, swelling, or pain. ? Fluid or blood coming from the cut or tear. ? Warmth. ? Pus or a bad smell.  You may be given a squirt bottle to use instead of wiping to clean the perineum area after you use the bathroom. Pat the area gently to dry it.  To relieve pain caused by an episiotomy, a tear, or swollen veins in the anus (hemorrhoids), take a warm sitz bath 2-3 times a day. In a sitz bath, the warm water should only come up to your hips and cover your buttocks.   Breast care  In the first few days after delivery, your breasts may feel heavy, full, and uncomfortable (breast engorgement). Milk may also leak from your breasts. Ask your health care provider about ways to help relieve the discomfort.  If you are breastfeeding: ? Wear a bra that supports your breasts and fits well. Use breast pads to absorb milk that leaks. ? Keep your nipples clean and dry. Apply creams and ointments as told. ? You may have uterine contractions every time you breastfeed for up to several weeks after delivery. This helps your uterus return to its normal size. ? If you have any problems with breastfeeding, notify your health care provider or lactation consultant.  If you are not breastfeeding: ? Avoid touching your breasts. Do not squeeze out (express) milk. Doing this can make your breasts produce more milk. ? Wear a good-fitting bra and use cold packs to help with swelling. Intimacy and sexuality  Ask your health care provider when you can engage in sexual activity. This may depend upon: ? Your risk of infection. ? How fast you are healing. ? Your comfort and desire to engage in sexual activity.  You are able to get pregnant after delivery, even if you have not had your period. Talk with your health care provider about methods of birth control (contraception) or family planning if you desire future  pregnancies. Medicines  Take over-the-counter and prescription medicines only as told by your health care provider.  Take an over-the-counter stool softener to help ease bowel movements as told by your health care provider.  If you were prescribed an antibiotic medicine, take it as told by your health care provider. Do not stop taking the antibiotic even if you start to feel better.  Review all previous and current prescriptions to check for possible transfer into breast milk. Activity  Gradually return to your normal activities as told by your health care provider.  Rest as much as possible. Nap while your baby is sleeping. Eating and drinking  Drink enough fluid to keep your urine pale yellow.  To help prevent or relieve constipation, eat high-fiber foods every day.  Choose healthy eating to support breastfeeding or weight loss goals.  Take your prenatal vitamins until your health care provider tells you to stop.  General tips/recommendations  Do not use any products that contain nicotine or tobacco. These products include cigarettes, chewing tobacco, and vaping devices, such as e-cigarettes. If you need help quitting, ask your health care provider.  Do not drink alcohol, especially if you are breastfeeding.  Do not take medications or drugs that are not prescribed to you, especially if you are breastfeeding.  Visit your health care provider for a postpartum checkup within the first 3-6 weeks after delivery.  Complete a comprehensive postpartum visit no later than 12 weeks after delivery.  Keep all follow-up visits for you and your baby. Contact a health care provider if:  You feel unusually sad or worried.  Your breasts become red, painful, or hard.  You have a fever or other signs of an infection.  You have bleeding that is soaking through one pad an hour or you have blood clots.  You have a severe headache that doesn't go away or you have vision changes.  You  have nausea and vomiting and are unable to eat or drink anything for 24 hours. Get help right away if:  You have chest pain or difficulty breathing.  You have sudden, severe leg pain.  You faint or have a seizure.  You have thoughts about hurting yourself or your baby. If you ever feel like you may hurt yourself or others, or have thoughts about taking your own life, get help right away. Go to your nearest emergency department or:  Call your local emergency services (911 in the [place]  The National Suicide Prevention Lifeline at 8431049164. This suicide crisis helpline is open 24 hours a day.  Text the Crisis Text Line at (629) 087-7276 (in the [place] Summary  The period of time after you deliver your newborn up to 6-12 weeks after delivery is called the postpartum period.  Keep all follow-up visits for you and your baby.  Review all previous and current prescriptions to check for possible transfer into breast milk.  Contact a health care provider if you feel unusually sad or worried during the postpartum period. This information is not intended to replace advice given to you by your health care provider. Make sure you discuss any questions you have with your health care provider. Document Revised: 03/17/2020 Document Reviewed: 03/17/2020 Elsevier Patient Education  2021 [employer] Inc.   Postpartum Baby Blues The postpartum period begins right after the birth of a baby. During this time, there is often joy and excitement. It is also a time of many changes in the life of the parents. A mother may feel happy one minute and sad or stressed the next. These feelings of sadness, called the baby blues, usually happen in the period right after the baby is born and go away within a week or two. What are the causes? The exact cause of this condition is not known. Changes in hormone levels after childbirth are believed to trigger some of the symptoms. Other factors that can play a role in these  mood changes include:  Lack of sleep.  Stressful life events, such as financial problems, caring for a loved one, or death of a loved one.  Genetics. What are the signs or symptoms? Symptoms of this condition include:  Changes in mood, such as going from extreme happiness to sadness.  A decrease in concentration.  Difficulty sleeping.  Crying spells and tearfulness.  Loss of appetite.  Irritability.  Anxiety. If these symptoms last for more than 2 weeks or become more severe, you may  have postpartum depression. How is this diagnosed? This condition is diagnosed based on an evaluation of your symptoms. Your health care provider may use a screening tool that includes a list of questions to help identify a person with the baby blues or postpartum depression. How is this treated? The baby blues usually go away on their own in 1-2 weeks. Social support is often what is needed. You will be encouraged to get adequate sleep and rest. Follow these instructions at home: Lifestyle  Get as much rest as you can. Take a nap when the baby sleeps.  Exercise regularly as told by your health care provider. Some women find yoga and walking to be helpful.  Eat a balanced and nourishing diet. This includes plenty of fruits and vegetables, whole grains, and lean proteins.  Do little things that you enjoy. Take a bubble bath, read your favorite magazine, or listen to your favorite music.  Avoid alcohol.  Ask for help with household chores, cooking, grocery shopping, or running errands. Do not try to do everything yourself. Consider hiring a postpartum doula to help. This is a professional who specializes in providing support to new mothers.  Try not to make any major life changes during pregnancy or right after giving birth. This can add stress.      General instructions  Talk to people close to you about how you are feeling. Get support from your partner, family members, friends, or other  new moms. You may want to join a support group.  Find ways to manage stress. This may include: ? Writing your thoughts and feelings in a journal. ? Spending time outside. ? Spending time with people who make you laugh.  Try to stay positive in how you think. Think about the things you are grateful for.  Take over-the-counter and prescription medicines only as told by your health care provider.  Let your health care provider know if you have any concerns.  Keep all postpartum visits. This is important. Contact a health care provider if:  Your baby blues do not go away after 2 weeks. Get help right away if:  You have thoughts of taking your own life (suicidal thoughts), or of harming your baby or someone else.  You see or hear things that are not there (hallucinations). If you ever feel like you may hurt yourself or others, or have thoughts about taking your own life, get help right away. Go to your nearest emergency department or:  Call your local emergency services (911 in the [place]  Call a suicide crisis helpline, such as the National Suicide Prevention Lifeline, at 256-530-6120. This is open 24 hours a day in the [place]  Text the Crisis Text Line at 431 077 4426 (in the [place] Summary  After giving birth, you may feel happy one minute and sad or stressed the next. Feelings of sadness that happen right after the baby is born and go away after a week or two are called the baby blues.  You can manage the baby blues by getting enough rest, eating a healthy diet, exercising, spending time with supportive people, and finding ways to manage stress.  If feelings of sadness and stress last longer than 2 weeks or get in the way of caring for your baby, talk with your health care provider. This may mean you have postpartum depression. This information is not intended to replace advice given to you by your health care provider. Make sure you discuss any questions you have with your health  care  provider. Document Revised: 12/25/2019 Document Reviewed: 12/25/2019 Elsevier Patient Education  2021 Else[employer] Inc.   Breastfeeding  Choosing to breastfeed is one of the best decisions you can make for yourself and your baby. A change in hormones during pregnancy causes your breasts to make breast milk in your milk-producing glands. Hormones prevent breast milk from being released before your baby is born. They also prompt milk flow after birth. Once breastfeeding has begun, thoughts of your baby, as well as his or her sucking or crying, can stimulate the release of milk from your milk-producing glands. Benefits of breastfeeding Research shows that breastfeeding offers many health benefits for infants and mothers. It also offers a cost-free and convenient way to feed your baby. For your baby  Your first milk (colostrum) helps your baby's digestive system to function better.  Special cells in your milk (antibodies) help your baby to fight off infections.  Breastfed babies are less likely to develop asthma, allergies, obesity, or type 2 diabetes. They are also at lower risk for sudden infant death syndrome (SIDS).  Nutrients in breast milk are better able to meet your baby's needs compared to infant formula.  Breast milk improves your baby's brain development. For you  Breastfeeding helps to create a very special bond between you and your baby.  Breastfeeding is convenient. Breast milk costs nothing and is always available at the correct temperature.  Breastfeeding helps to burn calories. It helps you to lose the weight that you gained during pregnancy.  Breastfeeding makes your uterus return faster to its size before pregnancy. It also slows bleeding (lochia) after you give birth.  Breastfeeding helps to lower your risk of developing type 2 diabetes, osteoporosis, rheumatoid arthritis, cardiovascular disease, and breast, ovarian, uterine, and endometrial cancer later in  life. Breastfeeding basics Starting breastfeeding  Find a comfortable place to sit or lie down, with your neck and back well-supported.  Place a pillow or a rolled-up blanket under your baby to bring him or her to the level of your breast (if you are seated). Nursing pillows are specially designed to help support your arms and your baby while you breastfeed.  Make sure that your baby's tummy (abdomen) is facing your abdomen.  Gently massage your breast. With your fingertips, massage from the outer edges of your breast inward toward the nipple. This encourages milk flow. If your milk flows slowly, you may need to continue this action during the feeding.  Support your breast with 4 fingers underneath and your thumb above your nipple (make the letter "C" with your hand). Make sure your fingers are well away from your nipple and your baby's mouth.  Stroke your baby's lips gently with your finger or nipple.  When your baby's mouth is open wide enough, quickly bring your baby to your breast, placing your entire nipple and as much of the areola as possible into your baby's mouth. The areola is the colored area around your nipple. ? More areola should be visible above your baby's upper lip than below the lower lip. ? Your baby's lips should be opened and extended outward (flanged) to ensure an adequate, comfortable latch. ? Your baby's tongue should be between his or her lower gum and your breast.  Make sure that your baby's mouth is correctly positioned around your nipple (latched). Your baby's lips should create a seal on your breast and be turned out (everted).  It is common for your baby to suck about 2-3 minutes in order to  start the flow of breast milk. Latching Teaching your baby how to latch onto your breast properly is very important. An improper latch can cause nipple pain, decreased milk supply, and poor weight gain in your baby. Also, if your baby is not latched onto your nipple  properly, he or she may swallow some air during feeding. This can make your baby fussy. Burping your baby when you switch breasts during the feeding can help to get rid of the air. However, teaching your baby to latch on properly is still the best way to prevent fussiness from swallowing air while breastfeeding. Signs that your baby has successfully latched onto your nipple  Silent tugging or silent sucking, without causing you pain. Infant's lips should be extended outward (flanged).  Swallowing heard between every 3-4 sucks once your milk has started to flow (after your let-down milk reflex occurs).  Muscle movement above and in front of his or her ears while sucking. Signs that your baby has not successfully latched onto your nipple  Sucking sounds or smacking sounds from your baby while breastfeeding.  Nipple pain. If you think your baby has not latched on correctly, slip your finger into the corner of your baby's mouth to break the suction and place it between your baby's gums. Attempt to start breastfeeding again. Signs of successful breastfeeding Signs from your baby  Your baby will gradually decrease the number of sucks or will completely stop sucking.  Your baby will fall asleep.  Your baby's body will relax.  Your baby will retain a small amount of milk in his or her mouth.  Your baby will let go of your breast by himself or herself. Signs from you  Breasts that have increased in firmness, weight, and size 1-3 hours after feeding.  Breasts that are softer immediately after breastfeeding.  Increased milk volume, as well as a change in milk consistency and color by the fifth day of breastfeeding.  Nipples that are not sore, cracked, or bleeding. Signs that your baby is getting enough milk  Wetting at least 1-2 diapers during the first 24 hours after birth.  Wetting at least 5-6 diapers every 24 hours for the first week after birth. The urine should be clear or pale  yellow by the age of 5 days.  Wetting 6-8 diapers every 24 hours as your baby continues to grow and develop.  At least 3 stools in a 24-hour period by the age of 5 days. The stool should be soft and yellow.  At least 3 stools in a 24-hour period by the age of 7 days. The stool should be seedy and yellow.  No loss of weight greater than 10% of birth weight during the first 3 days of life.  Average weight gain of 4-7 oz (113-198 g) per week after the age of 4 days.  Consistent daily weight gain by the age of 5 days, without weight loss after the age of 2 weeks. After a feeding, your baby may spit up a small amount of milk. This is normal. Breastfeeding frequency and duration Frequent feeding will help you make more milk and can prevent sore nipples and extremely full breasts (breast engorgement). Breastfeed when you feel the need to reduce the fullness of your breasts or when your baby shows signs of hunger. This is called "breastfeeding on demand." Signs that your baby is hungry include:  Increased alertness, activity, or restlessness.  Movement of the head from side to side.  Opening of the mouth  when the corner of the mouth or cheek is stroked (rooting).  Increased sucking sounds, smacking lips, cooing, sighing, or squeaking.  Hand-to-mouth movements and sucking on fingers or hands.  Fussing or crying. Avoid introducing a pacifier to your baby in the first 4-6 weeks after your baby is born. After this time, you may choose to use a pacifier. Research has shown that pacifier use during the first year of a baby's life decreases the risk of sudden infant death syndrome (SIDS). Allow your baby to feed on each breast as long as he or she wants. When your baby unlatches or falls asleep while feeding from the first breast, offer the second breast. Because newborns are often sleepy in the first few weeks of life, you may need to awaken your baby to get him or her to feed. Breastfeeding times  will vary from baby to baby. However, the following rules can serve as a guide to help you make sure that your baby is properly fed:  Newborns (babies 3 weeks of age or younger) may breastfeed every 1-3 hours.  Newborns should not go without breastfeeding for longer than 3 hours during the day or 5 hours during the night.  You should breastfeed your baby a minimum of 8 times in a 24-hour period. Breast milk pumping Pumping and storing breast milk allows you to make sure that your baby is exclusively fed your breast milk, even at times when you are unable to breastfeed. This is especially important if you go back to work while you are still breastfeeding, or if you are not able to be present during feedings. Your lactation consultant can help you find a method of pumping that works best for you and give you guidelines about how long it is safe to store breast milk.      Caring for your breasts while you breastfeed Nipples can become dry, cracked, and sore while breastfeeding. The following recommendations can help keep your breasts moisturized and healthy:  Avoid using soap on your nipples.  Wear a supportive bra designed especially for nursing. Avoid wearing underwire-style bras or extremely tight bras (sports bras).  Air-dry your nipples for 3-4 minutes after each feeding.  Use only cotton bra pads to absorb leaked breast milk. Leaking of breast milk between feedings is normal.  Use lanolin on your nipples after breastfeeding. Lanolin helps to maintain your skin's normal moisture barrier. Pure lanolin is not harmful (not toxic) to your baby. You may also hand express a few drops of breast milk and gently massage that milk into your nipples and allow the milk to air-dry. In the first few weeks after giving birth, some women experience breast engorgement. Engorgement can make your breasts feel heavy, warm, and tender to the touch. Engorgement peaks within 3-5 days after you give birth. The  following recommendations can help to ease engorgement:  Completely empty your breasts while breastfeeding or pumping. You may want to start by applying warm, moist heat (in the shower or with warm, water-soaked hand towels) just before feeding or pumping. This increases circulation and helps the milk flow. If your baby does not completely empty your breasts while breastfeeding, pump any extra milk after he or she is finished.  Apply ice packs to your breasts immediately after breastfeeding or pumping, unless this is too uncomfortable for you. To do this: ? Put ice in a plastic bag. ? Place a towel between your skin and the bag. ? Leave the ice on for 20 minutes,  2-3 times a day.  Make sure that your baby is latched on and positioned properly while breastfeeding. If engorgement persists after 48 hours of following these recommendations, contact your health care provider or a Advertising copywriter. Overall health care recommendations while breastfeeding  Eat 3 healthy meals and 3 snacks every day. Well-nourished mothers who are breastfeeding need an additional 450-500 calories a day. You can meet this requirement by increasing the amount of a balanced diet that you eat.  Drink enough water to keep your urine pale yellow or clear.  Rest often, relax, and continue to take your prenatal vitamins to prevent fatigue, stress, and low vitamin and mineral levels in your body (nutrient deficiencies).  Do not use any products that contain nicotine or tobacco, such as cigarettes and e-cigarettes. Your baby may be harmed by chemicals from cigarettes that pass into breast milk and exposure to secondhand smoke. If you need help quitting, ask your health care provider.  Avoid alcohol.  Do not use illegal drugs or marijuana.  Talk with your health care provider before taking any medicines. These include over-the-counter and prescription medicines as well as vitamins and herbal supplements. Some medicines that  may be harmful to your baby can pass through breast milk.  It is possible to become pregnant while breastfeeding. If birth control is desired, ask your health care provider about options that will be safe while breastfeeding your baby. Where to find more information: [employer]: www.llli.org Contact a health care provider if:  You feel like you want to stop breastfeeding or have become frustrated with breastfeeding.  Your nipples are cracked or bleeding.  Your breasts are red, tender, or warm.  You have: ? Painful breasts or nipples. ? A swollen area on either breast. ? A fever or chills. ? Nausea or vomiting. ? Drainage other than breast milk from your nipples.  Your breasts do not become full before feedings by the fifth day after you give birth.  You feel sad and depressed.  Your baby is: ? Too sleepy to eat well. ? Having trouble sleeping. ? More than 33 week old and wetting fewer than 6 diapers in a 24-hour period. ? Not gaining weight by 66 days of age.  Your baby has fewer than 3 stools in a 24-hour period.  Your baby's skin or the white parts of his or her eyes become yellow. Get help right away if:  Your baby is overly tired (lethargic) and does not want to wake up and feed.  Your baby develops an unexplained fever. Summary  Breastfeeding offers many health benefits for infant and mothers.  Try to breastfeed your infant when he or she shows early signs of hunger.  Gently tickle or stroke your baby's lips with your finger or nipple to allow the baby to open his or her mouth. Bring the baby to your breast. Make sure that much of the areola is in your baby's mouth. Offer one side and burp the baby before you offer the other side.  Talk with your health care provider or lactation consultant if you have questions or you face problems as you breastfeed. This information is not intended to replace advice given to you by your health care provider. Make  sure you discuss any questions you have with your health care provider. Document Revised: 09/26/2017 Document Reviewed: 08/03/2016 Elsevier Patient Education  2021 Elsevier Inc.   Breast Pumping Tips Breast pumping is a way to get milk out of your breasts.  You will then store the milk for your baby to use when you are away from home. There are three ways to pump. You can use your hand to massage and squeeze your breast (hand expression). You can use a hand-held machine to manually pump your milk. You can use an electric machine to pump your milk. In the beginning you may not get much milk. After a few days, your breasts should make more. Pumping can help you start making milk after your baby is born. Pumping helps you to keep making milk when you are away from your baby. When should I pump? You can start pumping soon after your baby is born. Follow these tips: When you are with your baby: Pump after you breastfeed. Pump from the free breast while you breastfeed. When you are away from your baby: Pump every 2-3 hours for 15 minutes. Pump both breasts at the same time if you can. If your baby drinks formula, pump around the time your baby gets the formula. If you drank alcohol, wait 2 hours before you pump. If you are going to have surgery, ask your doctor when you should pump again. How do I get ready to pump? Try to relax. Try these things to help your milk come in: Smell your baby's blanket or clothes. Look at a picture or video of your baby. Sit in a quiet, private space. Place a cloth on your breast. The cloth should be warm and a little wet. Massage your breast and nipple. Play relaxing music. Picture your milk flowing. Drink water and eat a snack. What are some tips? General tips for pumping breast milk Always wash your hands with soap and water for at least 20 seconds before pumping. If you do not get much milk or if pumping hurts, try different pump settings or a different  kind of pump. Drink enough fluid so your pee (urine) is clear or pale yellow. Wear clothing that opens in the front or is easy to take off. Pump milk into a clean bottle or container. Do not smoke or use any products that contain nicotine or tobacco. If you need help quitting, ask your doctor. Try to get a hands-free pumping bra, if possible. This makes it easy to pump breast milk. You can buy one or make your own.   Tips for storing breast milk Store breast milk in a clean, BPA-free container. These include: A glass or plastic bottle. A milk storage bag. Store only 2-4 ounces of breast milk in each container. Swirl the breast milk in the container. Do not shake it. Write down the date you pumped the milk on the container. This is how long you can store breast milk: Room temperature: 6-8 hours. It is best to use the milk within 4 hours. Cooler with ice packs: 24 hours. Refrigerator: 5-8 days, if the milk is clean. It is best to use the milk within 3 days. Freezer: 9-12 months, if the milk is clean and stored away from the freezer door. It is best to use the milk within 6 months. Put milk in the back of the refrigerator or freezer. Thaw frozen milk using warm water. Do not use the microwave.   Tips for choosing a breast pump When choosing a pump, keep the following things in mind: Manual breast pumps do not need electricity. They cost less. They can be hard to use. Electric breast pumps use electricity. They are more expensive. They are easier to use. They collect more milk.  The suction cup (flange) should be the right size. Before you buy the pump, check if your insurance will pay for it. Tips for caring for a breast pump Check the manual that came with your pump for cleaning tips. Try not to touch the inside of pump parts. Clean the pump after you use it. To do this: Wipe down the electrical part. Use a dry cloth or paper towel. Do not put this part in water or in cleaning  products. Wash the plastic parts with soap and warm water. Or use the dishwasher if the manual says it is safe. You do not need to clean the tubing unless it touched breast milk. Let all the parts air dry. Avoid drying them with a cloth or towel. When the parts are clean and dry, put the pump back together. Then store the pump. If there is water in the tubing when you want to pump: Attach the tubing to the pump. Turn on the pump to dry the tubing. Turn off the pump when the tube is dry. Summary Pumping can help you start making milk after your baby is born. It lets you keep making milk when you are away from your baby. When you are away from your baby, pump for about 15 minutes every 2-3 hours. Pump both breasts at the same time, if you can. This information is not intended to replace advice given to you by your health care provider. Make sure you discuss any questions you have with your health care provider. Document Revised: 04/12/2020 Document Reviewed: 04/12/2020 Elsevier Patient Education  2021 [employer].

## 2021-07-16 NOTE — L&D Delivery Note (Signed)
Delivery Note ? ?Kathleen Mcgrath is a [id number] at [redacted]w[redacted]d with an LMP of 02/09/21, consistent with [place] at [redacted]w[redacted]d.  ? ?First Stage: ?Labor onset: 0700 ?Induction: misoprostol, oxytocin, and AROM ?Analgesia /Anesthesia intrapartum: epidural ?AROM at 1140 ?GBS: negative ?IP Antibiotics: none ? ?Second Stage: ?Complete dilation at 1140 ?Onset of pushing at 1218 ?FHR second stage  125 bpm with moderate variability , early decels with pushing  ? ?Kathleen Mcgrath presented to L&D for an elective IOL at term.Marland Kitchen She was given 1 dose of buccal and vaginal Misoprostol, followed by Pitocin. She progressed  to C/C/+2 with a spontaneous urge to push.  She pushed  effectively  x 3 contractions over approximately 3 minutes for a spontaneous vaginal birth.  ?Delivery of a viable baby girl on 11/09/2021 ? at 1221 by F. Wendall Papa. ?Delivery of fetal head in OA position with restitution to LOA. Nuchal cord x1 reduced;  Anterior then posterior shoulders delivered easily with gentle downward traction. Baby placed on mom's chest, and attended to by baby RN Cord double clamped after cessation of pulsation, cut by FOB ? ?Cord blood sample collection: Not Indicated A POS ?Collection of cord blood donation N/A ?Arterial cord blood sample No ? ?Third Stage: ?Oxytocin bolus started after delivery of infant for hemorrhage prophylaxis  ?Placenta delivered Tomasa Blase intact with 3 VC @ 1226 ?Placenta disposition: discarded per protocol ?Uterine tone firm / bleeding small ? ?no laceration identified  ?Anesthesia for repair: N/A ?Repair None ?Est. Blood Loss (mL): 225 ? ?Complications: none ? ?Mom to postpartum.  Baby to Couplet care / Skin to Skin. ? ?Newborn: ?Information for the patient's newborn:  Gage, Treiber [[id number]]  ?Live born female  ?Birth Weight:   ?APGAR: 9, 9 ? ?Newborn Delivery   ?Birth date/time: 11/09/2021 12:21:00 ?Delivery type: Vaginal, Spontaneous ?  ?  ?  ? ?Feeding planned: Formula ? ?---------- ?[name], CNM ?Certified Nurse  Midwife ?[facility] ?[facility]   ?

## 2021-10-24 LAB — OB RESULTS CONSOLE RPR: RPR: NONREACTIVE

## 2021-10-24 LAB — OB RESULTS CONSOLE GBS: GBS: NEGATIVE

## 2021-10-24 LAB — OB RESULTS CONSOLE GC/CHLAMYDIA
Chlamydia: NEGATIVE
Gonorrhea: NEGATIVE

## 2021-10-24 LAB — OB RESULTS CONSOLE HIV ANTIBODY (ROUTINE TESTING): HIV: NONREACTIVE

## 2021-10-30 ENCOUNTER — Other Ambulatory Visit: Payer: Self-pay | Admitting: Obstetrics and Gynecology

## 2021-10-30 DIAGNOSIS — O99013 Anemia complicating pregnancy, third trimester: Secondary | ICD-10-CM

## 2021-10-30 NOTE — Progress Notes (Signed)
Maternal anemia in pregnancy, antepartum, third trimester  ?

## 2021-11-01 ENCOUNTER — Ambulatory Visit
Admission: RE | Admit: 2021-11-01 | Discharge: 2021-11-01 | Disposition: A | Discharge status: Home / Self Care | Payer: Medicaid Other | Source: Ambulatory Visit | Attending: Certified Nurse Midwife | Admitting: Certified Nurse Midwife

## 2021-11-01 DIAGNOSIS — R569 Unspecified convulsions: Secondary | ICD-10-CM | POA: Insufficient documentation

## 2021-11-01 MED ORDER — SODIUM CHLORIDE 0.9 % IV SOLN
300.0000 mg | Freq: Once | INTRAVENOUS | Status: AC
  Administered 2021-11-01: 300 mg via INTRAVENOUS
  Filled 2021-11-01: qty 300

## 2021-11-02 ENCOUNTER — Other Ambulatory Visit: Payer: Self-pay | Admitting: Obstetrics and Gynecology

## 2021-11-02 NOTE — Progress Notes (Signed)
Kathleen Mcgrath is a 26 y.o. [phone] female at Unknown dated by 7 week u/s.  She presents to L&D for elective IOL. ? ?Pregnancy Issues: ?NOB labs done 06/19/21 at 18 weeks ?Short interval pregnancies- Delivered 12/08/20 ?H/o mental health diagnoses ?Obesity BMI ?H/o Seizures ?Anemia ? ? ?Prenatal care site: [facility]  ? ?EFW: 09/26/21 2020g =56% ? ? ?Pertinent Results:  ?Prenatal Labs: ?Blood type/Rh A pos  ?Antibody screen neg  ?Rubella Immune  ?Varicella Immune  ?RPR NR  ?HBsAg Neg  ?HIV NR  ?GC neg  ?Chlamydia neg  ?Genetic screening   ?1 hour GTT 120  ?3 hour GTT   ?GBS neg  ? ? ?5. Post Partum Planning: ?- Infant feeding: TBD ?- Contraception: TBD ?- Tdap given 08/30/21 ?- Flu none ? ?Haroldine Laws, CNM ?11/02/2021 9:57 AM  ?

## 2021-11-09 ENCOUNTER — Inpatient Hospital Stay
Admission: EM | Admit: 2021-11-09 | Discharge: 2021-11-10 | DRG: 807 | Disposition: A | Discharge status: Home / Self Care | Payer: Medicaid Other | Attending: Obstetrics and Gynecology | Admitting: Obstetrics and Gynecology

## 2021-11-09 ENCOUNTER — Inpatient Hospital Stay: Payer: Medicaid Other | Admitting: Anesthesiology

## 2021-11-09 ENCOUNTER — Encounter: Payer: Self-pay | Admitting: Obstetrics and Gynecology

## 2021-11-09 ENCOUNTER — Other Ambulatory Visit: Payer: Self-pay

## 2021-11-09 DIAGNOSIS — Z3A39 39 weeks gestation of pregnancy: Secondary | ICD-10-CM | POA: Diagnosis not present

## 2021-11-09 DIAGNOSIS — O99214 Obesity complicating childbirth: Secondary | ICD-10-CM | POA: Diagnosis present

## 2021-11-09 DIAGNOSIS — O9081 Anemia of the puerperium: Secondary | ICD-10-CM | POA: Diagnosis not present

## 2021-11-09 DIAGNOSIS — G40409 Other generalized epilepsy and epileptic syndromes, not intractable, without status epilepticus: Secondary | ICD-10-CM | POA: Diagnosis present

## 2021-11-09 DIAGNOSIS — O99354 Diseases of the nervous system complicating childbirth: Principal | ICD-10-CM | POA: Diagnosis present

## 2021-11-09 DIAGNOSIS — Z3490 Encounter for supervision of normal pregnancy, unspecified, unspecified trimester: Principal | ICD-10-CM | POA: Diagnosis present

## 2021-11-09 DIAGNOSIS — O26893 Other specified pregnancy related conditions, third trimester: Principal | ICD-10-CM | POA: Diagnosis present

## 2021-11-09 LAB — CBC
HCT: 33.3 % — ABNORMAL LOW (ref 36.0–46.0)
Hemoglobin: 10.1 g/dL — ABNORMAL LOW (ref 12.0–15.0)
MCH: 22.9 pg — ABNORMAL LOW (ref 26.0–34.0)
MCHC: 30.3 g/dL (ref 30.0–36.0)
MCV: 75.3 fL — ABNORMAL LOW (ref 80.0–100.0)
Platelets: 266 10*3/uL (ref 150–400)
RBC: 4.42 MIL/uL (ref 3.87–5.11)
RDW: 17.7 % — ABNORMAL HIGH (ref 11.5–15.5)
WBC: 9.6 10*3/uL (ref 4.0–10.5)
nRBC: 0 % (ref 0.0–0.2)

## 2021-11-09 LAB — TYPE AND SCREEN
ABO/RH(D): A POS
Antibody Screen: NEGATIVE

## 2021-11-09 LAB — RPR: RPR Ser Ql: NONREACTIVE

## 2021-11-09 MED ORDER — LACTATED RINGERS IV SOLN
500.0000 mL | Freq: Once | INTRAVENOUS | Status: AC
  Administered 2021-11-09: 500 mL via INTRAVENOUS

## 2021-11-09 MED ORDER — LACTATED RINGERS IV SOLN: 500.0000 mL | INTRAVENOUS | Status: DC | PRN

## 2021-11-09 MED ORDER — LIDOCAINE-EPINEPHRINE (PF) 1.5 %-1:200000 IJ SOLN
INTRAMUSCULAR | Status: DC | PRN
  Administered 2021-11-09: 3 mL via EPIDURAL

## 2021-11-09 MED ORDER — SENNOSIDES-DOCUSATE SODIUM 8.6-50 MG PO TABS
2.0000 | ORAL_TABLET | ORAL | Status: DC
  Administered 2021-11-09: 2 via ORAL
  Filled 2021-11-09: qty 2

## 2021-11-09 MED ORDER — MISOPROSTOL 25 MCG QUARTER TABLET
25.0000 ug | ORAL_TABLET | Freq: Once | ORAL | Status: AC
  Administered 2021-11-09: 25 ug via BUCCAL

## 2021-11-09 MED ORDER — IBUPROFEN 600 MG PO TABS
600.0000 mg | ORAL_TABLET | Freq: Four times a day (QID) | ORAL | Status: DC
  Administered 2021-11-09 – 2021-11-10 (×2): 600 mg via ORAL
  Filled 2021-11-09 (×3): qty 1

## 2021-11-09 MED ORDER — SODIUM CHLORIDE 0.9 % IV SOLN: 250.0000 mL | INTRAVENOUS | Status: DC | PRN

## 2021-11-09 MED ORDER — ONDANSETRON HCL 4 MG/2ML IJ SOLN
4.0000 mg | INTRAMUSCULAR | Status: DC | PRN
Start: 2021-11-09 — End: 2021-11-10

## 2021-11-09 MED ORDER — COCONUT OIL OIL: 1.0000 "application " | TOPICAL_OIL | Status: DC | PRN

## 2021-11-09 MED ORDER — MISOPROSTOL 200 MCG PO TABS
ORAL_TABLET | ORAL | Status: AC
  Filled 2021-11-09: qty 4

## 2021-11-09 MED ORDER — OXYTOCIN BOLUS FROM INFUSION
333.0000 mL | Freq: Once | INTRAVENOUS | Status: AC
  Administered 2021-11-09: 333 mL via INTRAVENOUS

## 2021-11-09 MED ORDER — EPHEDRINE 5 MG/ML INJ
10.0000 mg | INTRAVENOUS | Status: DC | PRN
  Filled 2021-11-09: qty 2

## 2021-11-09 MED ORDER — ZOLPIDEM TARTRATE 5 MG PO TABS: 5.0000 mg | ORAL_TABLET | Freq: Every evening | ORAL | Status: DC | PRN

## 2021-11-09 MED ORDER — AMMONIA AROMATIC IN INHA
RESPIRATORY_TRACT | Status: AC
  Filled 2021-11-09: qty 10

## 2021-11-09 MED ORDER — MISOPROSTOL 25 MCG QUARTER TABLET
25.0000 ug | ORAL_TABLET | ORAL | Status: DC | PRN
  Administered 2021-11-09: 25 ug via VAGINAL
  Filled 2021-11-09: qty 1

## 2021-11-09 MED ORDER — FENTANYL-BUPIVACAINE-NACL 0.5-0.125-0.9 MG/250ML-% EP SOLN
12.0000 mL/h | EPIDURAL | Status: DC | PRN
  Administered 2021-11-09: 12 mL/h via EPIDURAL

## 2021-11-09 MED ORDER — ONDANSETRON HCL 4 MG/2ML IJ SOLN: 4.0000 mg | Freq: Four times a day (QID) | INTRAMUSCULAR | Status: DC | PRN

## 2021-11-09 MED ORDER — OXYTOCIN 10 UNIT/ML IJ SOLN
INTRAMUSCULAR | Status: AC
  Filled 2021-11-09: qty 2

## 2021-11-09 MED ORDER — BUPIVACAINE HCL (PF) 0.25 % IJ SOLN
INTRAMUSCULAR | Status: DC | PRN
  Administered 2021-11-09 (×2): 4 mL via EPIDURAL

## 2021-11-09 MED ORDER — BISACODYL 10 MG RE SUPP: 10.0000 mg | Freq: Every day | RECTAL | Status: DC | PRN

## 2021-11-09 MED ORDER — SERTRALINE HCL 100 MG PO TABS
100.0000 mg | ORAL_TABLET | Freq: Every day | ORAL | Status: DC
  Administered 2021-11-09 – 2021-11-10 (×2): 100 mg via ORAL
  Filled 2021-11-09 (×2): qty 1

## 2021-11-09 MED ORDER — ACETAMINOPHEN 325 MG PO TABS: 650.0000 mg | ORAL_TABLET | ORAL | Status: DC | PRN

## 2021-11-09 MED ORDER — OXYCODONE-ACETAMINOPHEN 5-325 MG PO TABS: 1.0000 | ORAL_TABLET | ORAL | Status: DC | PRN

## 2021-11-09 MED ORDER — BENZOCAINE-MENTHOL 20-0.5 % EX AERO: 1.0000 "application " | INHALATION_SPRAY | CUTANEOUS | Status: DC | PRN

## 2021-11-09 MED ORDER — ONDANSETRON HCL 4 MG PO TABS: 4.0000 mg | ORAL_TABLET | ORAL | Status: DC | PRN

## 2021-11-09 MED ORDER — SOD CITRATE-CITRIC ACID 500-334 MG/5ML PO SOLN: 30.0000 mL | ORAL | Status: DC | PRN

## 2021-11-09 MED ORDER — OXYTOCIN-SODIUM CHLORIDE 30-0.9 UT/500ML-% IV SOLN: 2.5000 [IU]/h | INTRAVENOUS | Status: DC

## 2021-11-09 MED ORDER — SODIUM CHLORIDE 0.9% FLUSH
3.0000 mL | Freq: Two times a day (BID) | INTRAVENOUS | Status: DC
  Administered 2021-11-09 – 2021-11-10 (×2): 3 mL via INTRAVENOUS

## 2021-11-09 MED ORDER — OXYCODONE-ACETAMINOPHEN 5-325 MG PO TABS: 2.0000 | ORAL_TABLET | ORAL | Status: DC | PRN

## 2021-11-09 MED ORDER — SODIUM CHLORIDE 0.9% FLUSH: 3.0000 mL | INTRAVENOUS | Status: DC | PRN

## 2021-11-09 MED ORDER — DIPHENHYDRAMINE HCL 50 MG/ML IJ SOLN: 12.5000 mg | INTRAMUSCULAR | Status: DC | PRN

## 2021-11-09 MED ORDER — LACTATED RINGERS IV SOLN: INTRAVENOUS | Status: DC

## 2021-11-09 MED ORDER — LEVETIRACETAM 500 MG PO TABS
500.0000 mg | ORAL_TABLET | Freq: Two times a day (BID) | ORAL | Status: DC
  Administered 2021-11-09 – 2021-11-10 (×3): 500 mg via ORAL
  Filled 2021-11-09 (×3): qty 1

## 2021-11-09 MED ORDER — DIPHENHYDRAMINE HCL 25 MG PO CAPS: 25.0000 mg | ORAL_CAPSULE | Freq: Four times a day (QID) | ORAL | Status: DC | PRN

## 2021-11-09 MED ORDER — LIDOCAINE HCL (PF) 1 % IJ SOLN
INTRAMUSCULAR | Status: AC
  Filled 2021-11-09: qty 30

## 2021-11-09 MED ORDER — FLEET ENEMA 7-19 GM/118ML RE ENEM: 1.0000 | ENEMA | Freq: Every day | RECTAL | Status: DC | PRN

## 2021-11-09 MED ORDER — PHENYLEPHRINE 80 MCG/ML (10ML) SYRINGE FOR IV PUSH (FOR BLOOD PRESSURE SUPPORT)
80.0000 ug | PREFILLED_SYRINGE | INTRAVENOUS | Status: DC | PRN
  Filled 2021-11-09: qty 10

## 2021-11-09 MED ORDER — WITCH HAZEL-GLYCERIN EX PADS
1.0000 | MEDICATED_PAD | CUTANEOUS | Status: DC | PRN
Start: 2021-11-09 — End: 2021-11-10

## 2021-11-09 MED ORDER — OXYTOCIN-SODIUM CHLORIDE 30-0.9 UT/500ML-% IV SOLN
1.0000 m[IU]/min | INTRAVENOUS | Status: DC
  Administered 2021-11-09: 2 m[IU]/min via INTRAVENOUS
  Filled 2021-11-09 (×2): qty 500

## 2021-11-09 MED ORDER — LAMOTRIGINE 100 MG PO TABS
200.0000 mg | ORAL_TABLET | Freq: Two times a day (BID) | ORAL | Status: DC
  Administered 2021-11-09 – 2021-11-10 (×3): 200 mg via ORAL
  Filled 2021-11-09 (×4): qty 2

## 2021-11-09 MED ORDER — DIBUCAINE (PERIANAL) 1 % EX OINT: 1.0000 "application " | TOPICAL_OINTMENT | CUTANEOUS | Status: DC | PRN

## 2021-11-09 MED ORDER — TERBUTALINE SULFATE 1 MG/ML IJ SOLN: 0.2500 mg | Freq: Once | INTRAMUSCULAR | Status: DC | PRN

## 2021-11-09 MED ORDER — FENTANYL-BUPIVACAINE-NACL 0.5-0.125-0.9 MG/250ML-% EP SOLN
EPIDURAL | Status: AC
  Filled 2021-11-09: qty 250

## 2021-11-09 MED ORDER — SIMETHICONE 80 MG PO CHEW: 80.0000 mg | CHEWABLE_TABLET | ORAL | Status: DC | PRN

## 2021-11-09 MED ORDER — LIDOCAINE HCL (PF) 1 % IJ SOLN
30.0000 mL | INTRAMUSCULAR | Status: DC | PRN
  Filled 2021-11-09: qty 30

## 2021-11-09 MED ORDER — OXYCODONE HCL 5 MG PO TABS: 5.0000 mg | ORAL_TABLET | ORAL | Status: DC | PRN

## 2021-11-09 MED ORDER — PRENATAL MULTIVITAMIN CH
1.0000 | ORAL_TABLET | Freq: Every day | ORAL | Status: DC
  Administered 2021-11-10: 1 via ORAL
  Filled 2021-11-09: qty 1

## 2021-11-09 MED ORDER — LIDOCAINE HCL (PF) 1 % IJ SOLN
INTRAMUSCULAR | Status: DC | PRN
  Administered 2021-11-09: 3 mL via SUBCUTANEOUS

## 2021-11-09 NOTE — Anesthesia Procedure Notes (Signed)
Epidural ?Patient location during procedure: OB ?Start time: 11/09/2021 7:21 AM ?End time: 11/09/2021 7:24 AM ? ?Staffing ?Resident/CRNA: Jeanine Luz, CRNA ?Performed: resident/CRNA  ? ?Preanesthetic Checklist ?Completed: patient identified, IV checked, site marked, risks and benefits discussed, surgical consent, monitors and equipment checked, pre-op evaluation and timeout performed ? ?Epidural ?Patient position: sitting ?Prep: ChloraPrep ?Patient monitoring: heart rate, continuous pulse ox and blood pressure ?Approach: midline ?Location: L4-L5 ?Injection technique: LOR saline ? ?Needle:  ?Needle type: Tuohy  ?Needle gauge: 18 G ?Needle length: 9 cm and 9 ?Needle insertion depth: 6.5 cm ?Catheter type: closed end flexible ?Catheter size: 20 Guage ?Catheter at skin depth: 12 cm ?Test dose: negative and 1.5% lidocaine with Epi 1:200 K ? ?Assessment ?Events: blood not aspirated, injection not painful, no injection resistance, no paresthesia and negative IV test ? ?Additional Notes ? ? ?Patient tolerated the insertion well without complications.Reason for block:procedure for pain ? ? ? ?

## 2021-11-09 NOTE — Progress Notes (Signed)
Labor Progress Note ? ?Kathleen Mcgrath is a 26 y.o. G5P2013 at [redacted]w[redacted]d by ultrasound admitted for induction of labor due to Elective at term. ? ?Subjective: resting in bed comfortably with epidural, denies feeling any pain or discomfort with contractions. ? ?Objective: ?BP 130/69   Pulse 89   Temp 99.1 ?F (37.3 ?C) (Oral)   Resp 18   Ht 5\' 2"  (1.575 m)   Wt 95.3 kg   LMP 02/09/2021 (Exact Date)   SpO2 98%   BMI 38.41 kg/m?  ?Notable VS details:  ? ?Fetal Assessment: ?FHT:  FHR: 125 bpm, variability: moderate,  accelerations:  Present,  decelerations:  Absent ?Category/reactivity:  Category I ?UC:   regular, every 2-3 minutes ?SVE:   6.5/90 by RN ?Membrane status: intact ?Amniotic color: intact ? ?Labs: ?Lab Results  ?Component Value Date  ? WBC 9.6 11/09/2021  ? HGB 10.1 (L) 11/09/2021  ? HCT 33.3 (L) 11/09/2021  ? MCV 75.3 (L) 11/09/2021  ? PLT 266 11/09/2021  ? ? ?Assessment / Plan: ?Induction of labor due to elective at term,  progressing well on pitocin ? ?Labor: Progressing on Pitocin, will continue to increase then AROM ?Preeclampsia:   N/A ?Fetal Wellbeing:  Category I ?Pain Control:  Epidural ?I/D:  n/a ?Anticipated MOD:  NSVD ? ?Kathleen Mcgrath LUCY 11/11/2021, CNM ?11/09/2021, 8:33 AM ? ? ? ? ? ? ?  ?

## 2021-11-09 NOTE — Anesthesia Preprocedure Evaluation (Signed)
Anesthesia Evaluation  ?Patient identified by MRN, date of birth, ID band ?Patient awake ? ? ? ?Reviewed: ?Allergy & Precautions, H&P , NPO status , Patient's Chart, lab work & pertinent test results ? ?Airway ?Mallampati: II ? ? ? ? ? ? Dental ?  ?Pulmonary ?neg pulmonary ROS,  ?  ? ? ? ? ? ? ? Cardiovascular ?(-) hypertensionnegative cardio ROS ? ? ? ? ?  ?Neuro/Psych ?Seizures -,    ? GI/Hepatic ?negative GI ROS, Neg liver ROS,   ?Endo/Other  ?negative endocrine ROS ? Renal/GU ?negative Renal ROS  ?negative genitourinary ?  ?Musculoskeletal ? ? Abdominal ?  ?Peds ? Hematology ?negative hematology ROS ?(+)   ?Anesthesia Other Findings ? ? Reproductive/Obstetrics ?(+) Pregnancy ? ?  ? ? ? ? ? ? ? ? ? ? ? ? ? ?  ?  ? ? ? ? ? ? ? ? ?Anesthesia Physical ?Anesthesia Plan ? ?ASA: 2 ? ?Anesthesia Plan: Epidural  ? ?Post-op Pain Management:   ? ?Induction:  ? ?PONV Risk Score and Plan:  ? ?Airway Management Planned:  ? ?Additional Equipment:  ? ?Intra-op Plan:  ? ?Post-operative Plan:  ? ?Informed Consent:  ? ?Plan Discussed with: Anesthesiologist and CRNA ? ?Anesthesia Plan Comments:   ? ? ? ? ? ? ?Anesthesia Quick Evaluation ? ?

## 2021-11-09 NOTE — H&P (Signed)
OB History & Physical  ? ?History of Present Illness:  ?Chief Complaint:  ? ?HPI:  ?Kathleen Mcgrath is a 26 y.o. [phone] female at [redacted]w[redacted]d dated by 7 week u/s.  She presents to L&D for elective induction of labor ? ?She reports:  ?-active fetal movement ?-no leakage of fluid ?-no vaginal bleeding ?-no contractions ? ?Pregnancy Issues: ?NOB labs done 06/19/21 at 18 weeks ?Short interval pregnancies- Delivered 12/08/20 ?H/o mental health diagnoses ?Obesity BMI ?H/o Seizures ?Anemia ? ? ?Maternal Medical History:  ? ?Past Medical History:  ?Diagnosis Date  ? Seizures (HCC)   ? Seizures (HCC)   ? ? ?Past Surgical History:  ?Procedure Laterality Date  ? NO PAST SURGERIES    ? ? ?Allergies  ?Allergen Reactions  ? Phenytoin Anaphylaxis and Itching  ? ? ?Prior to Admission medications   ?Medication Sig Start Date End Date Taking? Authorizing Provider  ?lamoTRIgine (LAMICTAL) 100 MG tablet Take 1 tablet (100 mg total) by mouth 2 (two) times daily. 12/09/20 12/09/21 Yes Haroldine Laws, CNM  ?levETIRAcetam (KEPPRA) 500 MG tablet Take 1 tablet (500 mg total) by mouth 2 (two) times daily. 12/09/20 12/09/21 Yes Haroldine Laws, CNM  ?sertraline (ZOLOFT) 100 MG tablet Take 100 mg by mouth daily.   Yes [provider]  ?Prenatal MV-Min-Fe Fum-FA-DHA (PRENATAL 1 PO) Take 1 tablet by mouth daily. Reported on 12/26/2015 ?Patient not taking: Reported on 09/12/2020    [provider]  ? ? ? ?Prenatal care site: [facility] ? ?Social History: She  reports that she has never smoked. She has never used smokeless tobacco. She reports that she does not drink alcohol and does not use drugs. ? ?Family History: family history includes Autism in her paternal grandfather; Breast cancer in her maternal grandmother; Diabetes in her mother; Hypertension in her father and mother; Seizures in her cousin; Stroke in her cousin.  ? ?Review of Systems: A full review of systems was performed and negative except as noted in the HPI.    ? ?Physical Exam:  ?Vital Signs: BP 123/63 (BP Location: Left Arm)   Pulse 82   Temp 98.8 ?F (37.1 ?C) (Oral)   Resp 16   Ht 5\' 2"  (1.575 m)   Wt 95.3 kg   LMP 02/09/2021 (Exact Date)   BMI 38.41 kg/m?  ? ?General:   alert and cooperative  ?Skin:  normal  ?Neurologic:    Alert & oriented x 3  ?Lungs:    Nl effort  ?Heart:   regular rate and rhythm  ?Abdomen:  soft, non-tender; bowel sounds normal; no masses,  no organomegaly  ?Extremities: : non-tender, symmetric, no edema bilaterally.     ? ?EFW: 09/26/21 2020g =56% ? ?Results for orders placed or performed during the hospital encounter of 11/09/21 (from the past 24 hour(s))  ?CBC     Status: Abnormal  ? Collection Time: 11/09/21 12:48 AM  ?Result Value Ref Range  ? WBC 9.6 4.0 - 10.5 K/uL  ? RBC 4.42 3.87 - 5.11 MIL/uL  ? Hemoglobin 10.1 (L) 12.0 - 15.0 g/dL  ? HCT 33.3 (L) 36.0 - 46.0 %  ? MCV 75.3 (L) 80.0 - 100.0 fL  ? MCH 22.9 (L) 26.0 - 34.0 pg  ? MCHC 30.3 30.0 - 36.0 g/dL  ? RDW 17.7 (H) 11.5 - 15.5 %  ? Platelets 266 150 - 400 K/uL  ? nRBC 0.0 0.0 - 0.2 %  ?Type and screen     Status: None  ? Collection  Time: 11/09/21 12:48 AM  ?Result Value Ref Range  ? ABO/RH(D) A POS   ? Antibody Screen NEG   ? Sample Expiration    ?  11/12/2021,2359 ?Performed at [facility], [address], [place], [place] [address] ?  ? ? ?Pertinent Results:  ?Prenatal Labs: ?Blood type/Rh A pos  ?Antibody screen neg  ?Rubella Immune  ?Varicella Immune  ?RPR NR  ?HBsAg Neg  ?HIV NR  ?GC neg  ?Chlamydia neg  ?Genetic screening    ?1 hour GTT 120  ?3 hour GTT    ?GBS neg  ? ?FHT: FHR: 120 bpm, variability: moderate,  accelerations:  Present,  decelerations:  Absent ?Category/reactivity:  Category I ?TOCO: none ?SVE: Dilation: 3 / Effacement (%): 70, 80 / Station: -2  ?  ? ? ?Assessment:  ?Kathleen Mcgrath is a 26 y.o. [phone] female at [redacted]w[redacted]d presents for elective induction of labor.  ? ?Plan:  ?1. Admit to Labor & Delivery; consents reviewed and obtained ? ?2. Fetal  Well being  ?- Fetal Tracing: Cat I ?- GBS neg ?- Presentation: vtx confirmed by SVE  ? ?3. Routine OB: ?- Prenatal labs reviewed, as above ?- Rh pos ?- CBC & T&S on admit ?- Clear fluids, IVF ? ?4. Induction of Labor ?-  Contractions by external toco in place ?-  Pelvis proven to 3090g ?-  Plan for induction with Cytotec ?-  Plan for continuous fetal monitoring  ?-  Maternal pain control as desired: IVPM, nitrous, regional anesthesia ?- Anticipate vaginal delivery ? ?5. Post Partum Planning: ?- Infant feeding: TBD ?- Contraception: TBD ?- Tdap given 08/30/21 ?- Flu none ? ?Haroldine Laws, CNM ?11/09/2021 6:39 AM ? ?  ?

## 2021-11-09 NOTE — Discharge Summary (Addendum)
Obstetrical Discharge Summary ? ?Patient Name: Kathleen Mcgrath ?DOB: 1996-02-18 ?MRN: [id number] ? ?Date of Admission: 11/09/2021 ?Date of Delivery: 11/09/21 ? ?Delivered by: [name], CNM  ?Date of Discharge: 11/10/2021 ? ?Primary OB: [facility] ?OQH:[id number] last menstrual period was 02/09/2021 (exact date). ?EDC Estimated Date of Delivery: 11/16/21 ?Gestational Age at Delivery: [redacted]w[redacted]d  ? ?Antepartum complications:  ?NOB labs done 06/19/21 at 18 weeks ?Short interval pregnancies- Delivered 12/08/20 ?H/o mental health diagnoses ?Obesity BMI ?H/o Seizures ?Anemia ? ?Admitting Diagnosis: Encounter for elective induction of labor [Z34.90]  ?Secondary Diagnosis: ?Patient Active Problem List  ? Diagnosis Date Noted  ? Encounter for elective induction of labor 11/09/2021  ? NSVD (normal spontaneous vaginal delivery) 12/08/2020  ? Hx of seizure disorder 12/04/2020  ? Supervision of normal pregnancy 05/04/2020  ? 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 ? ?Augmentation: AROM, Pitocin, and Cytotec ?Complications: None ?Intrapartum complications/course: Jace presented to L&D for an elective IOL at term.Marland Kitchen She was given 1 dose of buccal and vaginal Misoprostol, followed by Pitocin.She progressed to c/c+1, the AROM performed for clear fluid.  ?Delivery Type: spontaneous vaginal delivery ?Anesthesia: epidural ?Placenta: spontaneous ?Laceration: none ?Episiotomy: none ?Newborn Data: ?Live born female  ?Birth Weight:  6#14 ?APGAR: 9, 9 ? ?Newborn Delivery   ?Birth date/time: 11/09/2021 12:21:00 ?Delivery type: Vaginal, Spontaneous ?  ?  ? ? ? ?Postpartum Procedures: none ? ?Edinburgh:  ? ?  11/09/2021  ?  5:20 PM 12/08/2020  ? 11:02 AM  ?Inocente Salles Postnatal Depression Scale Screening Tool  ?I have been able to laugh and see the funny side of things. 1 0  ?I have looked forward with enjoyment to things. 1 0  ?I have blamed myself unnecessarily when things went wrong. 2 1  ?I have been anxious or worried  for no good reason. 1 2  ?I have felt scared or panicky for no good reason. 1 0  ?Things have been getting on top of me. 2 1  ?I have been so unhappy that I have had difficulty sleeping. 1 0  ?I have felt sad or miserable. 1 0  ?I have been so unhappy that I have been crying. 1 0  ?The thought of harming myself has occurred to me. 0 0  ?Edinburgh Postnatal Depression Scale Total 11 4  ?  ? ?Post partum course:  ?Patient had an uncomplicated postpartum course.  By time of discharge on PPD#1, her pain was controlled on oral pain medications; she had appropriate lochia and was ambulating, voiding without difficulty and tolerating regular diet.  She was deemed stable for discharge to home.   ? ? ?Discharge Physical Exam:  ?BP (!) 110/55 (BP Location: Right Arm)   Pulse 74   Temp 97.9 ?F (36.6 ?C) (Oral)   Resp 18   Ht 5\' 2"  (1.575 m)   Wt 95.3 kg   LMP 02/09/2021 (Exact Date)   SpO2 97%   Breastfeeding Unknown   BMI 38.41 kg/m?  ? ?General: NAD ?CV: RRR ?Pulm: CTABL, nl effort ?ABD: s/nd/nt, fundus firm and below the umbilicus ?Lochia: moderate ?Perineum:minimal edema/intact ?DVT Evaluation: LE non-ttp, no evidence of DVT on exam. ? ?Hemoglobin  ?Date Value Ref Range Status  ?11/10/2021 9.2 (L) 12.0 - 15.0 g/dL Final  ?11/12/2021 35/46/5681 11.1 - 15.9 g/dL Final  ? ?HCT  ?Date Value Ref Range Status  ?11/10/2021 30.5 (L) 36.0 - 46.0 % Final  ? ?Hematocrit  ?Date Value Ref Range Status  ?10/13/2015 33.8 (L) 34.0 -  46.6 % Final  ? ? ? ?Disposition: stable, discharge to home. ?Baby Feeding: formula ?Baby Disposition: home with mom ? ?Rh Immune globulin given: N/A ?Rubella vaccine given: N/A ?Varivax vaccine given: N/A ?Flu vaccine given in AP or PP setting: declined ?Tdap vaccine given in AP or PP setting: 08/30/21 ? ?Contraception: Desires BTL ? ?Prenatal Labs:  ?Blood type/Rh A pos  ?Antibody screen neg  ?Rubella Immune  ?Varicella Immune  ?RPR NR  ?HBsAg Neg  ?HIV NR  ?GC neg  ?Chlamydia neg  ?Genetic screening    ?1  hour GTT 120  ?3 hour GTT    ?GBS neg  ? ? ?Plan:  ?GABI MCFATE was discharged to home in good condition. ?Follow-up appointment with delivering provider in 6 weeks; needs to see MD for preop BTL consult in 2 weeks and any CNM in 3 for BP check.  ? ?Discharge Medications: ?Allergies as of 11/10/2021   ? ?   Reactions  ? Phenytoin Anaphylaxis, Itching  ? ?  ? ?  ?Medication List  ?  ? ?TAKE these medications   ? ?acetaminophen 325 MG tablet ?Commonly known as: Tylenol ?Take 2 tablets (650 mg total) by mouth every 4 (four) hours as needed (for pain scale < 4). ?  ?benzocaine-Menthol 20-0.5 % Aero ?Commonly known as: DERMOPLAST ?Apply 1 application. topically as needed for irritation (perineal discomfort). ?  ?coconut oil Oil ?Apply 1 application. topically as needed. ?  ?ibuprofen 600 MG tablet ?Commonly known as: ADVIL ?Take 1 tablet (600 mg total) by mouth every 6 (six) hours. ?  ?lamoTRIgine 200 MG tablet ?Commonly known as: LAMICTAL ?Take 1 tablet (200 mg total) by mouth 2 (two) times daily. ?What changed:  ?medication strength ?how much to take ?  ?levETIRAcetam 500 MG tablet ?Commonly known as: Keppra ?Take 1 tablet (500 mg total) by mouth 2 (two) times daily. ?  ?PRENATAL 1 PO ?Take 1 tablet by mouth daily. Reported on 12/26/2015 ?  ?senna-docusate 8.6-50 MG tablet ?Commonly known as: Senokot-S ?Take 2 tablets by mouth daily. ?  ?sertraline 100 MG tablet ?Commonly known as: ZOLOFT ?Take 100 mg by mouth daily. ?  ?witch hazel-glycerin pad ?Commonly known as: TUCKS ?Apply 1 application. topically as needed for hemorrhoids (for pain). ?  ? ?  ? ? ? Follow-up Information   ? ? [facility]. Go in 3 day(s).   ?Why: Appointment at 10:45am on Monday Nov 17, 2021 for BP check ?Contact information: ?[address] ?[place] [address] ?[phone] ? ?  ?  ? ? Conard Novak, MD. Schedule an appointment as soon as possible for a visit in 2 week(s).   ?Specialty: Obstetrics and  Gynecology ?Why: Pre-OP for BTL consult and mood check ?Contact information: ?[address] ?[place] [address] ?[phone] ? ? ?  ?  ? ? [name] Rolla Plate, CNM. Schedule an appointment as soon as possible for a visit in 6 week(s).   ?Specialty: Obstetrics ?Why: postpartum ?Contact information: ?[address] ?[place] [address] ?[phone] ? ? ?  ?  ? ?  ?  ? ?  ? ? ?Signed: ?Randa Ngo, CNM ?11/10/2021 ?11/10/2021  ?

## 2021-11-10 LAB — CBC
HCT: 30.5 % — ABNORMAL LOW (ref 36.0–46.0)
Hemoglobin: 9.2 g/dL — ABNORMAL LOW (ref 12.0–15.0)
MCH: 23 pg — ABNORMAL LOW (ref 26.0–34.0)
MCHC: 30.2 g/dL (ref 30.0–36.0)
MCV: 76.3 fL — ABNORMAL LOW (ref 80.0–100.0)
Platelets: 228 K/uL (ref 150–400)
RBC: 4 MIL/uL (ref 3.87–5.11)
RDW: 18.1 % — ABNORMAL HIGH (ref 11.5–15.5)
WBC: 10.1 K/uL (ref 4.0–10.5)
nRBC: 0 % (ref 0.0–0.2)

## 2021-11-10 MED ORDER — SODIUM CHLORIDE 0.9 % IV SOLN
300.0000 mg | Freq: Once | INTRAVENOUS | Status: AC
  Administered 2021-11-10: 300 mg via INTRAVENOUS
  Filled 2021-11-10: qty 300

## 2021-11-10 MED ORDER — ACETAMINOPHEN 325 MG PO TABS: 650.0000 mg | ORAL_TABLET | ORAL | Status: DC | PRN

## 2021-11-10 MED ORDER — LAMOTRIGINE 200 MG PO TABS: 200.0000 mg | ORAL_TABLET | Freq: Two times a day (BID) | ORAL | 0 refills | Status: DC

## 2021-11-10 MED ORDER — WITCH HAZEL-GLYCERIN EX PADS: 1.0000 "application " | MEDICATED_PAD | CUTANEOUS | 12 refills | Status: DC | PRN

## 2021-11-10 MED ORDER — IBUPROFEN 600 MG PO TABS: 600.0000 mg | ORAL_TABLET | Freq: Four times a day (QID) | ORAL | 0 refills | Status: DC

## 2021-11-10 MED ORDER — SENNOSIDES-DOCUSATE SODIUM 8.6-50 MG PO TABS: 2.0000 | ORAL_TABLET | ORAL | 0 refills | Status: DC

## 2021-11-10 MED ORDER — COCONUT OIL OIL: 1.0000 "application " | TOPICAL_OIL | 0 refills | Status: DC | PRN

## 2021-11-10 MED ORDER — BENZOCAINE-MENTHOL 20-0.5 % EX AERO: 1.0000 "application " | INHALATION_SPRAY | CUTANEOUS | Status: DC | PRN

## 2021-11-10 NOTE — TOC Initial Note (Addendum)
Transition of Care (TOC) - Initial/Assessment Note  ? ? ?Patient Details  ?Name: Kathleen Mcgrath ?MRN: [id number] ?Date of Birth: October 21, 1995 ? ?Transition of Care (TOC) CM/SW Contact:    ?Anselm Pancoast, RN ?Phone Number: ?11/10/2021, 11:44 AM ? ?Clinical Narrative:                 ?Spoke to patient regarding [place] score. Patient reports two other children at home and history of PPD. Understands signs/symptoms and resources if needed. Strong support system at home and remains on medication for PPD. SAHM and no issues with transportation. Engaged with [facility] services and bottlefeeding only. Engaged with pediatrician office. No current TOC needs or concerns.  ? ?  ?  ? ? ?Patient Goals and CMS Choice ?  ?  ?  ? ?Expected Discharge Plan and Services ?  ?  ?  ?  ?  ?                ?  ?  ?  ?  ?  ?  ?  ?  ?  ?  ? ?Prior Living Arrangements/Services ?  ?  ?  ?       ?  ?  ?  ?  ? ?Activities of Daily Living ?Home Assistive Devices/Equipment: None ?ADL Screening (condition at time of admission) ?Patient's cognitive ability adequate to safely complete daily activities?: Yes ?Is the patient deaf or have difficulty hearing?: No ?Does the patient have difficulty seeing, even when wearing glasses/contacts?: No ?Does the patient have difficulty concentrating, remembering, or making decisions?: No ?Patient able to express need for assistance with ADLs?: Yes ?Does the patient have difficulty dressing or bathing?: No ?Independently performs ADLs?: Yes (appropriate for developmental age) ?Does the patient have difficulty walking or climbing stairs?: No ?Weakness of Legs: None ?Weakness of Arms/Hands: None ? ?Permission Sought/Granted ?  ?  ?   ?   ?   ?   ? ?Emotional Assessment ?  ?  ?  ?  ?  ?  ? ?Admission diagnosis:  Encounter for elective induction of labor [Z34.90] ?Patient Active Problem List  ? Diagnosis Date Noted  ? Encounter for elective induction of labor 11/09/2021  ? NSVD (normal spontaneous vaginal delivery) 12/08/2020  ?  Hx of seizure disorder 12/04/2020  ? Supervision of normal pregnancy 05/04/2020  ? Intractable juvenile myoclonic epilepsy ([place]) 11/25/2014  ? ?PCP:  Minda Meo, CNM ?Pharmacy:   ?[facility], [address] ?[place] [address] ?Phone: [phone] Fax: [phone] ? ?[id number] - [place], [place] ?[address] ?[place] [address] ?Phone: [phone] Fax: [phone] ? ? ? ? ?Social Determinants of Health (SDOH) Interventions ?  ? ?Readmission Risk Interventions ?   ? View : No data to display.  ?  ?  ?  ? ? ? ?

## 2021-11-10 NOTE — Anesthesia Postprocedure Evaluation (Signed)
Anesthesia Post Note ? ?Patient: CAMILLE DRAGAN ? ?Procedure(s) Performed: AN AD HOC LABOR EPIDURAL ? ?Patient location during evaluation: Mother Baby ?Anesthesia Type: Epidural ?Level of consciousness: awake and alert ?Pain management: pain level controlled ?Vital Signs Assessment: post-procedure vital signs reviewed and stable ?Respiratory status: spontaneous breathing, nonlabored ventilation and respiratory function stable ?Cardiovascular status: stable ?Postop Assessment: no headache, no backache and epidural receding ?Anesthetic complications: no ? ? ?No notable events documented. ? ? ?Last Vitals:  ?Vitals:  ? 11/10/21 0313 11/10/21 0736  ?BP: 131/68 (!) 110/55  ?Pulse: 81 74  ?Resp: 18 18  ?Temp: 36.5 ?C 36.6 ?C  ?SpO2: 98% 97%  ?  ?Last Pain:  ?Vitals:  ? 11/10/21 0736  ?TempSrc: Oral  ?PainSc:   ? ? ?  ?  ?  ?  ?  ?  ? ?Lonzell Dorris Joanette Gula ? ? ? ? ?

## 2021-11-10 NOTE — Progress Notes (Signed)
Patient discharged home with family.  Discharge instructions, when to follow up, and prescriptions reviewed with patient.  Patient verbalized understanding. Patient will be escorted out by auxiliary.   

## 2021-11-10 NOTE — Discharge Instructions (Signed)

## 2021-11-10 NOTE — Progress Notes (Signed)
Post Partum Day 1 ?Subjective: ?Doing well, no complaints.  Tolerating regular diet, pain with PO meds, voiding and ambulating without difficulty. ? ?No CP SOB Fever,Chills, N/V or leg pain; denies nipple or breast pain, no HA change of vision, RUQ/epigastric pain ? ?Objective: ?BP (!) 110/55 (BP Location: Right Arm)   Pulse 74   Temp 97.9 ?F (36.6 ?C) (Oral)   Resp 18   Ht 5\' 2"  (1.575 m)   Wt 95.3 kg   LMP 02/09/2021 (Exact Date)   SpO2 97%   Breastfeeding Unknown   BMI 38.41 kg/m?  ? ?Vitals:  ? 11/09/21 1400 11/09/21 1415 11/09/21 1430 11/09/21 1445  ?BP: 127/60 136/77 136/79 (!) 130/59  ? 11/09/21 1500 11/09/21 1515 11/09/21 1600 11/09/21 1700  ?BP: (!) 125/55 137/61 124/76 137/74  ? 11/09/21 1916 11/09/21 2320 11/10/21 0313 11/10/21 0736  ?BP: 134/67 128/68 131/68 (!) 110/55  ? ? ?  ?Physical Exam:  ?General: NAD ?Breasts: soft/nontender ?CV: RRR ?Pulm: nl effort, CTABL ?Abdomen: soft, NT, BS x 4 ?Perineum: minimal edema, intact ?Lochia: small ?Uterine Fundus: fundus firm and 1 fb below umbilicus ?DVT Evaluation: no cords, ttp LEs  ? ?Recent Labs  ?  11/09/21 ?11/11/21 11/10/21 ?11/12/21  ?HGB 10.1* 9.2*  ?HCT 33.3* 30.5*  ?WBC 9.6 10.1  ?PLT 266 228  ? ? ?Assessment/Plan: ?26 y.o. 28 postpartum day # 1 ? ?- Continue routine PP care ?- encouraged snug fitting bra and cabbage leaves for bottlefeeding.  ?- Discussed contraceptive options including implant, IUDs hormonal and non-hormonal, injection, pills/ring/patch, condoms, and NFP. Strongly desires BTL, consent signed, will have 2wk preop visit for surgery scheduling.  ?- Acute blood loss anemia - hemodynamically stable and asymptomatic; IV Venofer today ? ? ? ?Disposition: Does desire Dc home today.  ? ? ? ?[id number], CNM ?11/10/2021  ?9:45 AM ? ? ? ? ?

## 2021-11-13 ENCOUNTER — Encounter: Payer: Self-pay | Admitting: Emergency Medicine

## 2021-11-13 ENCOUNTER — Emergency Department
Admission: EM | Admit: 2021-11-13 | Discharge: 2021-11-14 | Disposition: A | Discharge status: Other Acute Inpatient Hospital | Payer: Medicaid Other | Source: Home / Self Care | Attending: Emergency Medicine | Admitting: Emergency Medicine

## 2021-11-13 ENCOUNTER — Other Ambulatory Visit: Payer: Self-pay

## 2021-11-13 DIAGNOSIS — T43202A Poisoning by unspecified antidepressants, intentional self-harm, initial encounter: Secondary | ICD-10-CM | POA: Insufficient documentation

## 2021-11-13 DIAGNOSIS — R569 Unspecified convulsions: Secondary | ICD-10-CM | POA: Insufficient documentation

## 2021-11-13 DIAGNOSIS — G40B19 Juvenile myoclonic epilepsy, intractable, without status epilepticus: Secondary | ICD-10-CM | POA: Insufficient documentation

## 2021-11-13 DIAGNOSIS — Z8669 Personal history of other diseases of the nervous system and sense organs: Secondary | ICD-10-CM

## 2021-11-13 DIAGNOSIS — Z20822 Contact with and (suspected) exposure to covid-19: Secondary | ICD-10-CM | POA: Insufficient documentation

## 2021-11-13 DIAGNOSIS — T50902A Poisoning by unspecified drugs, medicaments and biological substances, intentional self-harm, initial encounter: Secondary | ICD-10-CM | POA: Diagnosis present

## 2021-11-13 DIAGNOSIS — F332 Major depressive disorder, recurrent severe without psychotic features: Secondary | ICD-10-CM | POA: Insufficient documentation

## 2021-11-13 DIAGNOSIS — T50901A Poisoning by unspecified drugs, medicaments and biological substances, accidental (unintentional), initial encounter: Secondary | ICD-10-CM

## 2021-11-13 DIAGNOSIS — Z046 Encounter for general psychiatric examination, requested by authority: Secondary | ICD-10-CM | POA: Insufficient documentation

## 2021-11-13 HISTORY — DX: Poisoning by unspecified drugs, medicaments and biological substances, accidental (unintentional), initial encounter: T50.901A

## 2021-11-13 LAB — CBC
HCT: 38.7 % (ref 36.0–46.0)
Hemoglobin: 11.4 g/dL — ABNORMAL LOW (ref 12.0–15.0)
MCH: 23.2 pg — ABNORMAL LOW (ref 26.0–34.0)
MCHC: 29.5 g/dL — ABNORMAL LOW (ref 30.0–36.0)
MCV: 78.7 fL — ABNORMAL LOW (ref 80.0–100.0)
Platelets: 318 10*3/uL (ref 150–400)
RBC: 4.92 MIL/uL (ref 3.87–5.11)
RDW: 19.5 % — ABNORMAL HIGH (ref 11.5–15.5)
WBC: 8.2 10*3/uL (ref 4.0–10.5)
nRBC: 0 % (ref 0.0–0.2)

## 2021-11-13 LAB — COMPREHENSIVE METABOLIC PANEL
ALT: 22 U/L (ref 0–44)
AST: 25 U/L (ref 15–41)
Albumin: 3.4 g/dL — ABNORMAL LOW (ref 3.5–5.0)
Alkaline Phosphatase: 123 U/L (ref 38–126)
Anion gap: 9 (ref 5–15)
BUN: 9 mg/dL (ref 6–20)
CO2: 23 mmol/L (ref 22–32)
Calcium: 8.9 mg/dL (ref 8.9–10.3)
Chloride: 106 mmol/L (ref 98–111)
Creatinine, Ser: 0.67 mg/dL (ref 0.44–1.00)
GFR, Estimated: >60 mL/min (ref >60)
Glucose, Bld: 99 mg/dL (ref 70–99)
Potassium: 3.7 mmol/L (ref 3.5–5.1)
Sodium: 138 mmol/L (ref 135–145)
Total Bilirubin: 0.5 mg/dL (ref 0.3–1.2)
Total Protein: 7.5 g/dL (ref 6.5–8.1)

## 2021-11-13 LAB — URINE DRUG SCREEN, QUALITATIVE (ARMC ONLY)
Amphetamines, Ur Screen: NOT DETECTED
Barbiturates, Ur Screen: NOT DETECTED
Benzodiazepine, Ur Scrn: NOT DETECTED
Cannabinoid 50 Ng, Ur ~~LOC~~: NOT DETECTED
Cocaine Metabolite,Ur ~~LOC~~: NOT DETECTED
MDMA (Ecstasy)Ur Screen: NOT DETECTED
Methadone Scn, Ur: NOT DETECTED
Opiate, Ur Screen: NOT DETECTED
Phencyclidine (PCP) Ur S: NOT DETECTED
Tricyclic, Ur Screen: NOT DETECTED

## 2021-11-13 LAB — RESP PANEL BY RT-PCR (FLU A&B, COVID) ARPGX2
Influenza A by PCR: NEGATIVE
Influenza B by PCR: NEGATIVE
SARS Coronavirus 2 by RT PCR: NEGATIVE

## 2021-11-13 LAB — ACETAMINOPHEN LEVEL: Acetaminophen (Tylenol), Serum: <10 ug/mL — ABNORMAL LOW (ref 10–30)

## 2021-11-13 LAB — MAGNESIUM: Magnesium: 2.3 mg/dL (ref 1.7–2.4)

## 2021-11-13 LAB — SALICYLATE LEVEL: Salicylate Lvl: <7 mg/dL — ABNORMAL LOW (ref 7.0–30.0)

## 2021-11-13 LAB — ETHANOL: Alcohol, Ethyl (B): <10 mg/dL (ref <10)

## 2021-11-13 MED ORDER — LEVETIRACETAM 500 MG PO TABS
500.0000 mg | ORAL_TABLET | Freq: Two times a day (BID) | ORAL | Status: DC
  Administered 2021-11-13 – 2021-11-14 (×2): 500 mg via ORAL
  Filled 2021-11-13 (×2): qty 1

## 2021-11-13 MED ORDER — LAMOTRIGINE 100 MG PO TABS
200.0000 mg | ORAL_TABLET | Freq: Two times a day (BID) | ORAL | Status: DC
  Administered 2021-11-13 – 2021-11-14 (×2): 200 mg via ORAL
  Filled 2021-11-13 (×2): qty 2

## 2021-11-13 NOTE — ED Notes (Signed)
This nurse spoke with poison control; VS updated and they were informed labs pending. Will call to update again after labs have resulted. Current symptoms consistent with medication taken.  ?

## 2021-11-13 NOTE — ED Notes (Signed)
Vol pending consult 

## 2021-11-13 NOTE — ED Triage Notes (Addendum)
Patient to ER from home via [facility] for c/o overdose on Zoloft. Patient received prescription on 4/13 for 30 pills of 100mg  Zoloft. Patient's bottle is now empty, believes she took at least 10 pills at approx 1615 this afternoon. Initial BP for fire department on scene was 167/96. EMS vitals were 161/79 followed by 152/74, HR 110. Patient has h/o epilepsy but has not had any episodes in 4-5 years. Patient was on Zoloft prior to delivery of child 4 days ago, has not been for follow up, so has not been officially diagnosed to her knowledge with postpartum depression. Patient denies any symptoms other than nausea currently. Sees [facility] for OBGYN care.   ?

## 2021-11-13 NOTE — ED Notes (Addendum)
ED tech at bedside to ensure pt safety. Pt resting quietly on stretcher with cardiac monitor in place. No complaints or distress noted at this time. Will continue to assess.   ?

## 2021-11-13 NOTE — ED Notes (Signed)
Pt asking for lights in room to be turned off. Pt informed that the lights needed to stay on so we can better monitor her. Pt ambulatory with steady gait but does report her vision is not quite right and states she is feeling slightly lightheaded. Pt also states she had 2 episodes of diarrhea since she got to the ED. Pt tearfully spoke about the stressors of her young children and arguing with her significant other which lead up to her taking the extra pills this afternoon. When asked if her goal was to end her life pt tearfully looked at this nurse and said "I am not sure". Pt states she had postpartum depression after her last child approx 10 months ago and her MD put her on Zoloft at that time. No continued issues or complications since then.  ?

## 2021-11-13 NOTE — ED Notes (Addendum)
NP from behavioral at the bedside for pt evaluation. ?

## 2021-11-13 NOTE — ED Provider Notes (Signed)
? ?[facility] ?Provider Note ? ? ? Event Date/Time  ? First MD Initiated Contact with Patient 11/13/21 1825   ?  (approximate) ? ? ?History  ? ?Drug Overdose ? ? ?HPI ? ?Kathleen Mcgrath is a 26 y.o. female with a history of depression, recent full-term delivery ? ?Patient reports that her delivery went well.  She has been recovering well and reports she is not having any further discharge.  No abdominal pain seems to be feeling well no fevers. ? ?No headache no chest pain no leg swelling no shortness of breath.  Her infant is being cared for by her mother now ? ?At about 4 PM today she was feeling very overwhelmed with the new baby and also taking care of her other child, she took a "handful" of her Zoloft medication. ? ?Denies any symptoms from doing so. ? ?She also has a history of seizure disorder, and reports that she took her normal dose of her medications this morning including her Keppra and Lamictal.  She reports she only overdosed on Zoloft, she reports she was not really intending to harm herself or feeling suicidal but rather just very stressed out and thought it might help ?  ? ? ?Physical Exam  ? ?Triage Vital Signs: ?ED Triage Vitals  ?Enc Vitals Group  ?   BP 11/13/21 1804 (!) 141/82  ?   Pulse Rate 11/13/21 1804 (!) 107  ?   Resp 11/13/21 1804 20  ?   Temp 11/13/21 1804 98.4 ?F (36.9 ?C)  ?   Temp Source 11/13/21 1804 Oral  ?   SpO2 11/13/21 1804 99 %  ?   Weight 11/13/21 1805 200 lb (90.7 kg)  ?   Height 11/13/21 1805 5\' 2"  (1.575 m)  ?   Head Circumference --   ?   Peak Flow --   ?   Pain Score 11/13/21 1805 0  ?   Pain Loc --   ?   Pain Edu? --   ?   Excl. in [place]? --   ? ? ?Most recent vital signs: ?Vitals:  ? 11/13/21 2300 11/13/21 2330  ?BP: 133/76 120/65  ?Pulse: 92 89  ?Resp: 18 (!) 22  ?Temp: 99.1 ?F (37.3 ?C) 99.1 ?F (37.3 ?C)  ?SpO2: 98% 97%  ? ? ? ?General: Awake, no distress.  ?Pupils equal round reactive to light, appears slightly dilated. ?CV:  Good peripheral  perfusion.  Normal rate and rhythm ?Resp:  Normal effort.  No distress.  Normal work of breathing ?Abd:  No distention.  ?Other:  No lower extremity edema. ? ?Normal motor movements.  No ataxia.  No seizure-like activity.  No tremulousness. ? ? ?ED Results / Procedures / Treatments  ? ?Labs ?(all labs ordered are listed, but only abnormal results are displayed) ?Labs Reviewed  ?COMPREHENSIVE METABOLIC PANEL - Abnormal; Notable for the following components:  ?    Result Value  ? Albumin 3.4 (*)   ? All other components within normal limits  ?CBC - Abnormal; Notable for the following components:  ? Hemoglobin 11.4 (*)   ? MCV 78.7 (*)   ? MCH 23.2 (*)   ? MCHC 29.5 (*)   ? RDW 19.5 (*)   ? All other components within normal limits  ?ACETAMINOPHEN LEVEL - Abnormal; Notable for the following components:  ? Acetaminophen (Tylenol), Serum <10 (*)   ? All other components within normal limits  ?SALICYLATE LEVEL - Abnormal; Notable for the following  components:  ? Salicylate Lvl [id number] (*)   ? All other components within normal limits  ?RESP PANEL BY RT-PCR (FLU A&B, COVID) ARPGX2  ?ETHANOL  ?MAGNESIUM  ?URINE DRUG SCREEN, QUALITATIVE ([facility] ONLY)  ?LAMOTRIGINE LEVEL  ?LEVETIRACETAM LEVEL  ? ? ? ?EKG ? ?Reviewed inter by me at 1810 ?Heart rate 100 ?QRS 80 ?QTc 440 ?Sinus tachycardia, no evidence of acute ischemia or ectopy.  QT interval normal ? ? ? ? ?RADIOLOGY ? ? ? ? ?PROCEDURES: ? ?Critical Care performed: No ? ?Procedures ? ? ?MEDICATIONS ORDERED IN ED: ?Medications  ?lamoTRIgine (LAMICTAL) tablet 200 mg (has no administration in time range)  ?levETIRAcetam (KEPPRA) tablet 500 mg (has no administration in time range)  ? ? ? ?IMPRESSION / MDM / ASSESSMENT AND PLAN / ED COURSE  ?I reviewed the triage vital signs and the nursing notes. ?             ?               ?Differential diagnosis includes, but is not limited to, intentional overdose.  The exact intent whether this was suicidal in nature or not is unclear to me,  patient reports she did so at 4 PM took a handful of Zoloft because she was feeling very stressed out was hoping it would help calm her.  To me she denies that this was an obvious suicide attempt, but I have placed her under IVC and ordered psychiatry consult ? ?She denies coingestants or taking any other medications reports discrete overdose at 4 PM today taking Zoloft 1 handful.  She does have some element of dilated pupils but otherwise no tremulousness no seizure-like activity.  Does have a history of underlying seizure disorder.  EKG reassuring. ? ?We will monitor patient closely. ? ?Labs reviewed notable for an normal salicylate and acetaminophen level ? ?Comprehensive metabolic panel is normal except for slightly reduced albumin.  She is mildly hypertensive, suspect possibly due to the Zoloft, she does not appear to have any clear symptoms to support anything such as preeclampsia or help based on clinical history and exam and labs.  Hemoglobin 11 slight anemia. ? ?Case discussed with poison control consult and who advised observation for 12 hours, supportive therapy. ? ?The patient is on the cardiac monitor to evaluate for evidence of arrhythmia and/or significant heart rate changes. ? ?Clinical Course as of 11/13/21 2337  ?Mon Nov 13, 2021  ?1817 Delay in initial assessment as I was responding to a CODE BLUE [MQ]  ?2205 EKG is reviewed and interpreted by me ?Heart rate 105 ?QRS 80 ?QTc 440. [MQ]  ?2335 Case was discussed with poison control, nurse Seth Bake discussed the case with them at this point, and poison control recommending 12-hour observation.  We will plan to observe medically till approximately 6 AM.  If patient improved at that point would consider clearance for psychiatric admission.  Psychiatry team has advised that they plan to admit her for psychiatric stabilization once medically cleared [MQ]  ?  ?Clinical Course User Index ?[MQ] Delman Kitten, MD  ? ?Labs reviewed notable for very mild anemia.   Normal comprehensive metabolic panel, slight reduction in albumin.  Salicylate and Tylenol levels normal  ? ?FINAL CLINICAL IMPRESSION(S) / ED DIAGNOSES  ? ?Final diagnoses:  ?Intentional overdose, initial encounter [facility])  ? ?Ongoing care and disposition assigned to Dr. Karma Greaser 12:40 PM, follow-up on ongoing observation.  Patient under IVC being observed for reported overdose of Zoloft.  Anticipate disposition decision at  approximately 6 AM ? ?Rx / DC Orders  ? ?ED Discharge Orders   ? ? None  ? ?  ? ? ? ?Note:  This document was prepared using Dragon voice recognition software and may include unintentional dictation errors. ?  Delman Kitten, MD ?11/13/21 2337 ? ?

## 2021-11-13 NOTE — ED Notes (Signed)
Dr. Fanny Bien notified of HTN in postpartum patient. No additional orders/interventions at this time per Dr. Fanny Bien. ?

## 2021-11-14 ENCOUNTER — Encounter: Payer: Self-pay | Admitting: Psychiatry

## 2021-11-14 ENCOUNTER — Inpatient Hospital Stay
Admission: AD | Admit: 2021-11-14 | Discharge: 2021-11-17 | DRG: 885 | Disposition: A | Discharge status: Home / Self Care | Payer: Medicaid Other | Source: Intra-hospital | Attending: Psychiatry | Admitting: Psychiatry

## 2021-11-14 DIAGNOSIS — R030 Elevated blood-pressure reading, without diagnosis of hypertension: Secondary | ICD-10-CM | POA: Diagnosis present

## 2021-11-14 DIAGNOSIS — T50902A Poisoning by unspecified drugs, medicaments and biological substances, intentional self-harm, initial encounter: Secondary | ICD-10-CM | POA: Diagnosis present

## 2021-11-14 DIAGNOSIS — Z79899 Other long term (current) drug therapy: Secondary | ICD-10-CM

## 2021-11-14 DIAGNOSIS — R45851 Suicidal ideations: Secondary | ICD-10-CM | POA: Diagnosis present

## 2021-11-14 DIAGNOSIS — Z20822 Contact with and (suspected) exposure to covid-19: Secondary | ICD-10-CM | POA: Diagnosis present

## 2021-11-14 DIAGNOSIS — R41843 Psychomotor deficit: Secondary | ICD-10-CM | POA: Diagnosis present

## 2021-11-14 DIAGNOSIS — Z888 Allergy status to other drugs, medicaments and biological substances status: Secondary | ICD-10-CM | POA: Diagnosis not present

## 2021-11-14 DIAGNOSIS — G40B09 Juvenile myoclonic epilepsy, not intractable, without status epilepticus: Secondary | ICD-10-CM | POA: Diagnosis present

## 2021-11-14 DIAGNOSIS — G479 Sleep disorder, unspecified: Secondary | ICD-10-CM | POA: Diagnosis present

## 2021-11-14 DIAGNOSIS — F332 Major depressive disorder, recurrent severe without psychotic features: Secondary | ICD-10-CM | POA: Diagnosis present

## 2021-11-14 DIAGNOSIS — G40909 Epilepsy, unspecified, not intractable, without status epilepticus: Secondary | ICD-10-CM | POA: Diagnosis present

## 2021-11-14 DIAGNOSIS — Z8249 Family history of ischemic heart disease and other diseases of the circulatory system: Secondary | ICD-10-CM

## 2021-11-14 DIAGNOSIS — T1491XA Suicide attempt, initial encounter: Secondary | ICD-10-CM | POA: Diagnosis present

## 2021-11-14 MED ORDER — ACETAMINOPHEN 325 MG PO TABS
650.0000 mg | ORAL_TABLET | Freq: Four times a day (QID) | ORAL | Status: DC | PRN
  Administered 2021-11-16: 650 mg via ORAL
  Filled 2021-11-14: qty 2

## 2021-11-14 MED ORDER — ALUM & MAG HYDROXIDE-SIMETH 200-200-20 MG/5ML PO SUSP: 30.0000 mL | ORAL | Status: DC | PRN

## 2021-11-14 MED ORDER — MAGNESIUM HYDROXIDE 400 MG/5ML PO SUSP: 30.0000 mL | Freq: Every day | ORAL | Status: DC | PRN

## 2021-11-14 MED ORDER — HYDROXYZINE HCL 25 MG PO TABS: 25.0000 mg | ORAL_TABLET | Freq: Three times a day (TID) | ORAL | Status: DC | PRN

## 2021-11-14 MED ORDER — LAMOTRIGINE 100 MG PO TABS
200.0000 mg | ORAL_TABLET | Freq: Two times a day (BID) | ORAL | Status: DC
  Administered 2021-11-14 – 2021-11-17 (×6): 200 mg via ORAL
  Filled 2021-11-14 (×6): qty 2

## 2021-11-14 MED ORDER — LEVETIRACETAM 500 MG PO TABS
500.0000 mg | ORAL_TABLET | Freq: Two times a day (BID) | ORAL | Status: DC
  Administered 2021-11-14 – 2021-11-17 (×6): 500 mg via ORAL
  Filled 2021-11-14 (×7): qty 1

## 2021-11-14 NOTE — ED Notes (Signed)
Pt requested shower; provided clean hospital clothing and linens.  Shower setup provided with soap, shampoo, toothbrush/toothpaste, and deoderant.  Pt able to preform own ADL's with no assistance.   ?

## 2021-11-14 NOTE — ED Notes (Signed)
Encouraged patient to tidy room, provided trash can for patient to throw away any trash in patient room with staff supervision.  

## 2021-11-14 NOTE — Tx Team (Signed)
Initial Treatment Plan ?11/14/2021 ?5:26 PM ?Kathleen Mcgrath ?[id number] ? ? ? ?PATIENT STRESSORS: ?Medication change or noncompliance   ?Other: Overwhelmed with family life    ? ? ?PATIENT STRENGTHS: ?Ability for insight  ?General fund of knowledge  ?Supportive family/friends  ? ? ?PATIENT IDENTIFIED PROBLEMS: ?SI by Overdose   ?Depression  ?Anxiety  ?Sleep Disturbance  ?  ?  ?  ?  ?  ?  ? ?DISCHARGE CRITERIA:  ?Ability to meet basic life and health needs ?Improved stabilization in mood, thinking, and/or behavior ?Medical problems require only outpatient monitoring ?Need for constant or close observation no longer present ?Reduction of life-threatening or endangering symptoms to within safe limits ? ?PRELIMINARY DISCHARGE PLAN: ?Outpatient therapy ?Return to previous living arrangement ? ?PATIENT/FAMILY INVOLVEMENT: ?This treatment plan has been presented to and reviewed with the patient, Kathleen Mcgrath. The patient has been given the opportunity to ask questions and make suggestions. ? ?Doyce Para, RN ?11/14/2021, 5:26 PM ?

## 2021-11-14 NOTE — Plan of Care (Signed)
New admission. ? ?Problem: Education: ?Goal: Knowledge of [facility] Education information/materials will improve ?Outcome: Not Progressing ?Goal: Emotional status will improve ?Outcome: Not Progressing ?Goal: Mental status will improve ?Outcome: Not Progressing ?Goal: Verbalization of understanding the information provided will improve ?Outcome: Not Progressing ?  ?Problem: Health Behavior/Discharge Planning: ?Goal: Compliance with treatment plan for underlying cause of condition will improve ?Outcome: Not Progressing ?  ?Problem: Safety: ?Goal: Periods of time without injury will increase ?Outcome: Not Progressing ?  ?Problem: Coping: ?Goal: Ability to identify and develop effective coping behavior will improve ?Outcome: Not Progressing ?  ?Problem: Self-Concept: ?Goal: Level of anxiety will decrease ?Outcome: Not Progressing ?  ?Problem: Coping: ?Goal: Coping ability will improve ?Outcome: Not Progressing ?Goal: Will verbalize feelings ?Outcome: Not Progressing ?  ?Problem: Health Behavior/Discharge Planning: ?Goal: Compliance with therapeutic regimen will improve ?Outcome: Not Progressing ?  ?Problem: Self-Concept: ?Goal: Level of anxiety will decrease ?Outcome: Not Progressing ?  ?Problem: Education: ?Goal: Ability to make informed decisions regarding treatment will improve ?Outcome: Not Progressing ?  ?Problem: Coping: ?Goal: Coping ability will improve ?Outcome: Not Progressing ?  ?Problem: Health Behavior/Discharge Planning: ?Goal: Identification of resources available to assist in meeting health care needs will improve ?Outcome: Not Progressing ?  ?Problem: Self-Concept: ?Goal: Will verbalize positive feelings about self ?Outcome: Not Progressing ?  ?

## 2021-11-14 NOTE — Progress Notes (Signed)
Admission Note:  ? ?Report was received on a 26 year-old female who presents IVC in no acute distress for the treatment of SI and Depression. Patient appears flat and depressed. Patient was calm and cooperative with admission process. Patient presents endorsing both depression and anxiety, stating that she is stressed and overwhelmed with her having an eleven-month old and four-day old baby at home. Patient also reports difficulty sleeping, which played a major part in her taking an overdose of her medication. Patient reports that she wasn't trying to hurt herself, however, she did not go into further detail regarding this matter. Patient denies SI/HI/AVH and pain. Patient has a past medical history of Epilepsy and Depression, in which she has also suffered from postpartum depression during her pregnancies. Patient has no stated goals for treatment at this time. Skin was assessed with Nicole Kindred, RN and found to be clear of any abnormal marks apart from a healing cut to her right big toe, a mole on the inner side of her left big toe, and multiple tattoos to her right hip, right forearm, left wrist, the back of her neck, and her back, on the upper right shoulder blade. Patient searched and no contraband found and unit policies explained and understanding verbalized. Consents obtained. Food and fluids offered, and fluids accepted. Patient had no additional questions or concerns at this time. Patient remains safe on the unit. ? ?

## 2021-11-14 NOTE — ED Provider Notes (Signed)
Emergency Medicine Observation Re-evaluation Note ? ?Kathleen Mcgrath is a 26 y.o. female, seen on rounds today.  Pt initially presented to the ED for complaints of Drug Overdose ?Currently, the patient is resting. ? ?Physical Exam  ?BP 128/75   Pulse 80   Temp 99.1 ?F (37.3 ?C) (Oral)   Resp 16   Ht 1.575 m (5\' 2" )   Wt 90.7 kg   SpO2 97%   Breastfeeding No   BMI 36.58 kg/m?  ?Physical Exam ?Gen:  No acute distress ?Resp:  Breathing easily and comfortably, no accessory muscle usage ?Neuro:  Moving all four extremities, no gross focal neuro deficits.  Ambulatory without difficulty and without requiring any assistance. ?Psych:  Resting currently, calm when awake. ? ?ED Course / MDM  ?EKG:  ? ?I have reviewed the labs performed to date as well as medications administered while in observation.  Recent changes in the last 24 hours include initial EDP evaluation and psychiatric evaluation. ? ?Plan  ?Current plan is for psychiatric admission.  The patient has been observed for 12 hours and is now medically cleared as per recommendations from poison control.  She has been ambulatory without difficulty with no medical complaints or concerns at this time.  Psychiatry consulted on the patient in the emergency department and recommends inpatient treatment. ? ? ?Kathleen Mcgrath is under involuntary commitment. ?  ? ?  ?Hinda Kehr, MD ?11/14/21 [phone] ? ?

## 2021-11-14 NOTE — ED Notes (Signed)
Breakfast tray placed at bedside, pt sleeping. 

## 2021-11-14 NOTE — ED Notes (Signed)
Pt. Got dinner tray with a drink. 

## 2021-11-14 NOTE — Consult Note (Addendum)
[facility] Face-to-Face Psychiatry Consult   Reason for Consult:Drug Overdose Referring Physician: Dr.Quale Patient Identification: Kathleen Mcgrath MRN:  [id number] Principal Diagnosis: <principal problem not specified> Diagnosis:  Active Problems:   Intractable juvenile myoclonic epilepsy (HCC)   Hx of seizure disorder   MDD (major depressive disorder), recurrent episode, severe (HCC)   Drug overdose, intentional (HCC)   Total Time spent with patient: 1 hour  Subjective: "I was feeling overwhelmed and it happened." Kathleen Mcgrath is a 26 y.o. female patient [facility] via [facility] for c/o overdose on Zoloft. Per the ED triage nurse's note, Patient received prescription on 4/13 for 30 pills of 100mg  Zoloft. Patient's bottle is now empty, believes she took at least 10 pills at approx 1615 this afternoon. Initial BP for fire department on scene was 167/96. EMS vitals were 161/79 followed by 152/74, HR 110. Patient has h/o epilepsy but has not had any episodes in 4-5 years. Patient was on Zoloft prior to delivery of child 4 days ago, has not been for follow up, so has not been officially diagnosed to her knowledge with postpartum depression. Patient denies any symptoms other than nausea currently. [facility].    The patient is seen, and she is emotional during her assessment. The patient states she has four daughters at home. She says he has nine, five, 11 months, and a four day -old baby at home. She states she became overwhelmed, and "I did the unthinkable." The patient lives with the father of her youngest children. The patient says she became upset with her kid's dad and took more of her Zoloft than she was supposed. The patient voiced she does not want to harm any of her children. She states, "I was overwhelmed."   This provider saw the patient face-to-face; the chart was reviewed, and consulted with Dr. Fanny BienQuale on 11/13/2021 due to the patient's care. It was discussed with  the EDP that the patient does meet the criteria to be admitted to the psychiatric inpatient unit.  On evaluation, the patient is alert and oriented x 4, calm, cooperative, and mood-congruent with affect. The patient does not appear to be responding to internal or external stimuli. Neither is the patient presenting with any delusional thinking. The patient denies auditory or visual hallucinations. The patient denies any suicidal, homicidal, or self-harm ideations. The patient is not presenting with any psychotic or paranoid behaviors. During an encounter with the patient, she could answer questions appropriately.  HPI: Per Dr. Fanny BienQuale, Kathleen Mcgrath is a 26 y.o. female with a history of depression, recent full-term delivery Patient reports that her delivery went well.  She has been recovering well and reports she is not having any further discharge.  No abdominal pain seems to be feeling well no fevers. No headache no chest pain no leg swelling no shortness of breath.  Her infant is being cared for by her mother now At about 4 PM today she was feeling very overwhelmed with the new baby and also taking care of her other child, she took a "handful" of her Zoloft medication. Denies any symptoms from doing so. She also has a history of seizure disorder, and reports that she took her normal dose of her medications this morning including her Keppra and Lamictal.  She reports she only overdosed on Zoloft, she reports she was not really intending to harm herself or feeling suicidal but rather just very stressed out and thought it might help  Past  Psychiatric History: Seizures (HCC)  Risk to Self:   Risk to Others:   Prior Inpatient Therapy:   Prior Outpatient Therapy:    Past Medical History:  Past Medical History:  Diagnosis Date   Seizures (HCC)    Seizures (HCC)     Past Surgical History:  Procedure Laterality Date   NO PAST SURGERIES     Family History:  Family History  Problem Relation Age of  Onset   Diabetes Mother    Hypertension Mother    Hypertension Father    Breast cancer Maternal Grandmother    Autism Paternal Grandfather    Seizures Cousin        maternal   Stroke Cousin        maternal   Family Psychiatric  History:  Social History:  Social History   Substance and Sexual Activity  Alcohol Use No   Alcohol/week: 0.0 standard drinks     Social History   Substance and Sexual Activity  Drug Use No    Social History   Socioeconomic History   Marital status: Significant Other    Spouse name: Harrold Donath   Number of children: Not on file   Years of education: Not on file   Highest education level: Not on file  Occupational History   Occupation: homemaker  Tobacco Use   Smoking status: Never   Smokeless tobacco: Never  Vaping Use   Vaping Use: Never used  Substance and Sexual Activity   Alcohol use: No    Alcohol/week: 0.0 standard drinks   Drug use: No   Sexual activity: Yes    Partners: Male    Birth control/protection: None  Other Topics Concern   Not on file  Social History Narrative   Not on file   Social Determinants of Health   Financial Resource Strain: Not on file  Food Insecurity: Not on file  Transportation Needs: Not on file  Physical Activity: Not on file  Stress: Not on file  Social Connections: Not on file   Additional Social History:    Allergies:   Allergies  Allergen Reactions   Phenytoin Anaphylaxis and Itching    Labs:  Results for orders placed or performed during the hospital encounter of 11/13/21 (from the past 48 hour(s))  Comprehensive metabolic panel     Status: Abnormal   Collection Time: 11/13/21  5:54 PM  Result Value Ref Range   Sodium 138 135 - 145 mmol/L   Potassium 3.7 3.5 - 5.1 mmol/L   Chloride 106 98 - 111 mmol/L   CO2 23 22 - 32 mmol/L   Glucose, Bld 99 70 - 99 mg/dL    Comment: Glucose reference range applies only to samples taken after fasting for at least 8 hours.   BUN 9 6 - 20 mg/dL    Creatinine, Ser 1.61 0.44 - 1.00 mg/dL   Calcium 8.9 8.9 - 09.6 mg/dL   Total Protein 7.5 6.5 - 8.1 g/dL   Albumin 3.4 (L) 3.5 - 5.0 g/dL   AST 25 15 - 41 U/L   ALT 22 0 - 44 U/L   Alkaline Phosphatase 123 38 - 126 U/L   Total Bilirubin 0.5 0.3 - 1.2 mg/dL   GFR, Estimated >04 >54 mL/min    Comment: (NOTE) Calculated using the CKD-EPI Creatinine Equation (2021)    Anion gap 9 5 - 15    Comment: Performed at [facility], [address] [place], [place] [address]  cbc  Status: Abnormal   Collection Time: 11/13/21  5:54 PM  Result Value Ref Range   WBC 8.2 4.0 - 10.5 K/uL   RBC 4.92 3.87 - 5.11 MIL/uL   Hemoglobin 11.4 (L) 12.0 - 15.0 g/dL   HCT 16.1 09.6 - 04.5 %   MCV 78.7 (L) 80.0 - 100.0 fL   MCH 23.2 (L) 26.0 - 34.0 pg   MCHC 29.5 (L) 30.0 - 36.0 g/dL   RDW 40.9 (H) 81.1 - 91.4 %   Platelets 318 150 - 400 K/uL   nRBC 0.0 0.0 - 0.2 %    Comment: Performed at [facility], [address] [place], [place] [address]  Urine Drug Screen, Qualitative     Status: None   Collection Time: 11/13/21  5:54 PM  Result Value Ref Range   Tricyclic, Ur Screen NONE DETECTED NONE DETECTED   Amphetamines, Ur Screen NONE DETECTED NONE DETECTED   MDMA (Ecstasy)Ur Screen NONE DETECTED NONE DETECTED   Cocaine Metabolite,Ur [place] NONE DETECTED NONE DETECTED   Opiate, Ur Screen NONE DETECTED NONE DETECTED   Phencyclidine (PCP) Ur S NONE DETECTED NONE DETECTED   Cannabinoid 50 Ng, Ur [place] NONE DETECTED NONE DETECTED   Barbiturates, Ur Screen NONE DETECTED NONE DETECTED   Benzodiazepine, Ur Scrn NONE DETECTED NONE DETECTED   Methadone Scn, Ur NONE DETECTED NONE DETECTED    Comment: (NOTE) Tricyclics + metabolites, urine    Cutoff 1000 ng/mL Amphetamines + metabolites, urine  Cutoff 1000 ng/mL MDMA (Ecstasy), urine              Cutoff 500 ng/mL Cocaine Metabolite, urine          Cutoff 300 ng/mL Opiate + metabolites, urine        Cutoff 300 ng/mL Phencyclidine (PCP), urine          Cutoff 25 ng/mL Cannabinoid, urine                 Cutoff 50 ng/mL Barbiturates + metabolites, urine  Cutoff 200 ng/mL Benzodiazepine, urine              Cutoff 200 ng/mL Methadone, urine                   Cutoff 300 ng/mL  The urine drug screen provides only a preliminary, unconfirmed analytical test result and should not be used for non-medical purposes. Clinical consideration and professional judgment should be applied to any positive drug screen result due to possible interfering substances. A more specific alternate chemical method must be used in order to obtain a confirmed analytical result. Gas chromatography / mass spectrometry (GC/MS) is the preferred confirm atory method. Performed at [facility], [address] [place], [place] [address]   Magnesium     Status: None   Collection Time: 11/13/21  5:54 PM  Result Value Ref Range   Magnesium 2.3 1.7 - 2.4 mg/dL    Comment: Performed at [facility], [address] [place], [place] [address]  Acetaminophen level     Status: Abnormal   Collection Time: 11/13/21  9:38 PM  Result Value Ref Range   Acetaminophen (Tylenol), Serum <10 (L) 10 - 30 ug/mL    Comment: (NOTE) Therapeutic concentrations vary significantly. A range of 10-30 ug/mL  may be an effective concentration for many patients. However, some  are best treated at concentrations outside of this range. Acetaminophen concentrations >150 ug/mL at 4 hours after ingestion  and >50 ug/mL at 12 hours  after ingestion are often associated with  toxic reactions.  Performed at [facility], [address] [place], [place] [address]   Salicylate level     Status: Abnormal   Collection Time: 11/13/21  9:38 PM  Result Value Ref Range   Salicylate Lvl <7.0 (L) 7.0 - 30.0 mg/dL    Comment: Performed at [facility], [address] [place], [place] [address]  Ethanol     Status: None   Collection Time: 11/13/21  9:38 PM   Result Value Ref Range   Alcohol, Ethyl (B) <10 <10 mg/dL    Comment: (NOTE) Lowest detectable limit for serum alcohol is 10 mg/dL.  For medical purposes only. Performed at [facility], [address] [place], [place] [address]   Resp Panel by RT-PCR (Flu A&B, Covid) Nasopharyngeal Swab     Status: None   Collection Time: 11/13/21  9:38 PM   Specimen: Nasopharyngeal Swab; Nasopharyngeal(NP) swabs in vial transport medium  Result Value Ref Range   SARS Coronavirus 2 by RT PCR NEGATIVE NEGATIVE    Comment: (NOTE) SARS-CoV-2 target nucleic acids are NOT DETECTED.  The SARS-CoV-2 RNA is generally detectable in upper respiratory specimens during the acute phase of infection. The lowest concentration of SARS-CoV-2 viral copies this assay can detect is 138 copies/mL. A negative result does not preclude SARS-Cov-2 infection and should not be used as the sole basis for treatment or other patient management decisions. A negative result may occur with  improper specimen collection/handling, submission of specimen other than nasopharyngeal swab, presence of viral mutation(s) within the areas targeted by this assay, and inadequate number of viral copies(<138 copies/mL). A negative result must be combined with clinical observations, patient history, and epidemiological information. The expected result is Negative.  Fact Sheet for Patients:  BloggerCourse.com  Fact Sheet for Healthcare Providers:  SeriousBroker.it  This test is no t yet approved or cleared by the [place] FDA and  has been authorized for detection and/or diagnosis of SARS-CoV-2 by FDA under an Emergency Use Authorization (EUA). This EUA will remain  in effect (meaning this test can be used) for the duration of the COVID-19 declaration under Section 564(b)(1) of the Act, 21 U.S.C.section 360bbb-3(b)(1), unless the authorization is terminated  or revoked  sooner.       Influenza A by PCR NEGATIVE NEGATIVE   Influenza B by PCR NEGATIVE NEGATIVE    Comment: (NOTE) The Xpert Xpress SARS-CoV-2/FLU/RSV plus assay is intended as an aid in the diagnosis of influenza from Nasopharyngeal swab specimens and should not be used as a sole basis for treatment. Nasal washings and aspirates are unacceptable for Xpert Xpress SARS-CoV-2/FLU/RSV testing.  Fact Sheet for Patients: BloggerCourse.com  Fact Sheet for Healthcare Providers: SeriousBroker.it  This test is not yet approved or cleared by the [place] FDA and has been authorized for detection and/or diagnosis of SARS-CoV-2 by FDA under an Emergency Use Authorization (EUA). This EUA will remain in effect (meaning this test can be used) for the duration of the COVID-19 declaration under Section 564(b)(1) of the Act, 21 U.S.C. section 360bbb-3(b)(1), unless the authorization is terminated or revoked.  Performed at [facility], [address] [place], [place] [address]     Current Facility-Administered Medications  Medication Dose Route Frequency Provider Last Rate Last Admin   lamoTRIgine (LAMICTAL) tablet 200 mg  200 mg Oral BID Sharyn Creamer, MD   200 mg at 11/13/21 2348   levETIRAcetam (KEPPRA) tablet 500 mg  500 mg Oral BID Sharyn Creamer, MD   500 mg at 11/13/21 2348   Current Outpatient Medications  Medication Sig Dispense Refill   acetaminophen (TYLENOL) 325 MG tablet Take 2 tablets (650 mg total) by mouth every 4 (four) hours as needed (for pain scale < 4).     lamoTRIgine (LAMICTAL) 200 MG tablet Take 1 tablet (200 mg total) by mouth 2 (two) times daily. 30 tablet 0   levETIRAcetam (KEPPRA) 500 MG tablet Take 1 tablet (500 mg total) by mouth 2 (two) times daily. 60 tablet 11   sertraline (ZOLOFT) 100 MG tablet Take 100 mg by mouth daily.     benzocaine-Menthol (DERMOPLAST) 20-0.5 % AERO Apply 1 application. topically as  needed for irritation (perineal discomfort).     coconut oil OIL Apply 1 application. topically as needed.  0   ibuprofen (ADVIL) 600 MG tablet Take 1 tablet (600 mg total) by mouth every 6 (six) hours. (Patient not taking: Reported on 11/13/2021) 30 tablet 0   Prenatal MV-Min-Fe Fum-FA-DHA (PRENATAL 1 PO) Take 1 tablet by mouth daily. Reported on 12/26/2015 (Patient not taking: Reported on 09/12/2020)     senna-docusate (SENOKOT-S) 8.6-50 MG tablet Take 2 tablets by mouth daily. 30 tablet 0   witch hazel-glycerin (TUCKS) pad Apply 1 application. topically as needed for hemorrhoids (for pain). (Patient not taking: Reported on 11/13/2021) 40 each 12    Musculoskeletal: Strength & Muscle Tone: within normal limits Gait & Station: normal Patient leans: N/A Psychiatric Specialty Exam:  Presentation  General Appearance: Appropriate for Environment  Eye Contact:Good  Speech:Clear and Coherent  Speech Volume:Decreased  Handedness:Right   Mood and Affect  Mood:Depressed; Anxious  Affect:Blunt; Depressed; Tearful   Thought Process  Thought Processes:Coherent  Descriptions of Associations:Intact  Orientation:Full (Time, Place and Person)  Thought Content:Logical  History of Schizophrenia/Schizoaffective disorder:No data recorded Duration of Psychotic Symptoms:No data recorded Hallucinations:Hallucinations: None  Ideas of Reference:None  Suicidal Thoughts:Suicidal Thoughts: No  Homicidal Thoughts:Homicidal Thoughts: No   Sensorium  Memory:Immediate Good; Recent Good; Remote Good  Judgment:Poor  Insight:Poor   Executive Functions  Concentration:Fair  Attention Span:Fair  Recall:No data recorded Fund of Knowledge:Good  Language:Good   Psychomotor Activity  Psychomotor Activity:Psychomotor Activity: Normal   Assets  Assets:Communication Skills; Desire for Improvement; Leisure Time; Resilience; Social Support   Sleep  Sleep:Sleep: Fair Number of Hours of  Sleep: 4   Physical Exam: Physical Exam Vitals and nursing note reviewed.  Constitutional:      Appearance: Normal appearance.  HENT:     Head: Normocephalic and atraumatic.     Nose: Nose normal.     Mouth/Throat:     Mouth: Mucous membranes are moist.  Eyes:     Pupils: Pupils are equal, round, and reactive to light.  Cardiovascular:     Rate and Rhythm: Normal rate.     Pulses: Normal pulses.  Pulmonary:     Effort: Pulmonary effort is normal.  Musculoskeletal:        General: Normal range of motion.     Cervical back: Normal range of motion and neck supple.  Neurological:     General: No focal deficit present.     Mental Status: She is alert and oriented to person, place, and time.  Psychiatric:        Attention and Perception: Attention and perception normal.        Mood and Affect: Mood is anxious and depressed. Affect is blunt and flat.  Speech: Speech normal.        Behavior: Behavior is cooperative.        Thought Content: Thought content normal.        Cognition and Memory: Cognition and memory normal.        Judgment: Judgment is impulsive.   Review of Systems  Psychiatric/Behavioral:  Positive for depression. The patient is nervous/anxious and has insomnia.   All other systems reviewed and are negative. Blood pressure 120/65, pulse 89, temperature 99.1 F (37.3 C), temperature source Oral, resp. rate (!) 22, height 5\' 2"  (1.575 m), weight 90.7 kg, SpO2 97 %, not currently breastfeeding. Body mass index is 36.58 kg/m.  Treatment Plan Summary: Plan Patient does meet criteria for psychiatric inpatient admission  Disposition: Recommend psychiatric Inpatient admission when medically cleared. Supportive therapy provided about ongoing stressors.  , NP 11/14/2021 2:57 AM

## 2021-11-14 NOTE — ED Notes (Addendum)
Report given to Elson Areas RN  ?

## 2021-11-14 NOTE — Plan of Care (Signed)
?  Problem: Education: ?Goal: Mental status will improve ?Outcome: Progressing ?Goal: Verbalization of understanding the information provided will improve ?Outcome: Progressing ?  ?Problem: Health Behavior/Discharge Planning: ?Goal: Compliance with treatment plan for underlying cause of condition will improve ?Outcome: Progressing ?  ?Problem: Self-Concept: ?Goal: Level of anxiety will decrease ?Outcome: Progressing ?  ?

## 2021-11-15 DIAGNOSIS — F332 Major depressive disorder, recurrent severe without psychotic features: Secondary | ICD-10-CM

## 2021-11-15 LAB — LAMOTRIGINE LEVEL: Lamotrigine Lvl: 5 ug/mL (ref 2.0–20.0)

## 2021-11-15 LAB — LEVETIRACETAM LEVEL: Levetiracetam Lvl: 6 ug/mL — ABNORMAL LOW (ref 10.0–40.0)

## 2021-11-15 MED ORDER — FLUOXETINE HCL 20 MG PO CAPS
20.0000 mg | ORAL_CAPSULE | Freq: Every day | ORAL | Status: DC
Start: 2021-11-15 — End: 2021-11-17
  Administered 2021-11-15 – 2021-11-17 (×3): 20 mg via ORAL
  Filled 2021-11-15 (×3): qty 1

## 2021-11-15 NOTE — [facility] Suicide Risk Assessment (Signed)
[facility] INPATIENT:  Family/Significant Other Suicide Prevention Education ? ?Suicide Prevention Education:  ?Patient Refusal for Family/Significant Other Suicide Prevention Education: The patient Kathleen Mcgrath has refused to provide written consent for family/significant other to be provided Family/Significant Other Suicide Prevention Education during admission and/or prior to discharge.  Physician notified. ? ?CSW completed SPE with patient. Discussed potential triggers leading to suicidal ideation in addition to coping skills one might use in order to delay and distract self from self harming behaviors. CSW encouraged patient to utilize emergency services if they felt unable to maintain their safety. SPE flyer provided to patient at this time.  ? ? ?Corky Crafts ?11/15/2021, 3:54 PM ?
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
## 2021-11-15 NOTE — Group Note (Signed)
[facility] LCSW Group Therapy Note ? ? ?Group Date: 11/15/2021 ?Start Time: 1300 ?End Time: 1400 ? ? ?Type of Therapy/Topic:  Group Therapy:  Emotion Regulation ? ?Participation Level:  Minimal  ? ?Mood: ? ?Description of Group:   ? The purpose of this group is to assist patients in learning to regulate negative emotions and experience positive emotions. Patients will be guided to discuss ways in which they have been vulnerable to their negative emotions. These vulnerabilities will be juxtaposed with experiences of positive emotions or situations, and patients challenged to use positive emotions to combat negative ones. Special emphasis will be placed on coping with negative emotions in conflict situations, and patients will process healthy conflict resolution skills. ? ?Therapeutic Goals: ?Patient will identify two positive emotions or experiences to reflect on in order to balance out negative emotions:  ?Patient will label two or more emotions that they find the most difficult to experience:  ?Patient will be able to demonstrate positive conflict resolution skills through discussion or role plays:  ? ?Summary of Patient Progress: ?Patient was present in group.  Patient participated minimally in group.  Patient shared that an emotion that she has felt recently is "fear".  Patient was present for group for it's entirety and appeared attentive to others.   ? ? ? ?Therapeutic Modalities:   ?Cognitive Behavioral Therapy ?Feelings Identification ?Dialectical Behavioral Therapy ? ? ?Harden Mo, LCSW ?

## 2021-11-15 NOTE — Progress Notes (Signed)
Recreation Therapy Notes ? ? ?Date: 11/15/2021 ? ?Time: 10:40am   ? ?Location: Courtyard   ? ?Behavioral response: N/A ?  ?Intervention Topic: Communication   ? ?Discussion/Intervention: ?Patient refused to attend group.  ? ?Clinical Observations/Feedback:  ?Patient refused to attend group.  ?  ?Shequilla Goodgame LRT/CTRS ? ? ? ? ? ? ? ?Casaundra Takacs ?11/15/2021 12:16 PM ?

## 2021-11-15 NOTE — BH IP Treatment Plan (Signed)
Interdisciplinary Treatment and Diagnostic Plan Update ? ?11/15/2021 ?Time of Session: 0930 ?Kathleen Mcgrath ?MRN: [id number] ? ?Principal Diagnosis: Suicide attempt [facility]) ? ?Secondary Diagnoses: Principal Problem: ?  Suicide attempt ([place]) ? ? ?Current Medications:  ?Current Facility-Administered Medications  ?Medication Dose Route Frequency Provider Last Rate Last Admin  ? acetaminophen (TYLENOL) tablet 650 mg  650 mg Oral Q6H PRN Sherlon Handing, NP      ? alum & mag hydroxide-simeth (MAALOX/MYLANTA) 200-200-20 MG/5ML suspension 30 mL  30 mL Oral Q4H PRN Sherlon Handing, NP      ? FLUoxetine (PROZAC) capsule 20 mg  20 mg Oral Daily Clapacs, John T, MD      ? hydrOXYzine (ATARAX) tablet 25 mg  25 mg Oral TID PRN Sherlon Handing, NP      ? lamoTRIgine (LAMICTAL) tablet 200 mg  200 mg Oral BID Waldon Merl F, NP   200 mg at 11/15/21 0825  ? levETIRAcetam (KEPPRA) tablet 500 mg  500 mg Oral BID Waldon Merl F, NP   500 mg at 11/15/21 0825  ? magnesium hydroxide (MILK OF MAGNESIA) suspension 30 mL  30 mL Oral Daily PRN Sherlon Handing, NP      ? ?PTA Medications: ?Medications Prior to Admission  ?Medication Sig Dispense Refill Last Dose  ? acetaminophen (TYLENOL) 325 MG tablet Take 2 tablets (650 mg total) by mouth every 4 (four) hours as needed (for pain scale < 4).     ? benzocaine-Menthol (DERMOPLAST) 20-0.5 % AERO Apply 1 application. topically as needed for irritation (perineal discomfort).     ? coconut oil OIL Apply 1 application. topically as needed.  0   ? ibuprofen (ADVIL) 600 MG tablet Take 1 tablet (600 mg total) by mouth every 6 (six) hours. (Patient not taking: Reported on 11/13/2021) 30 tablet 0   ? lamoTRIgine (LAMICTAL) 200 MG tablet Take 1 tablet (200 mg total) by mouth 2 (two) times daily. 30 tablet 0   ? levETIRAcetam (KEPPRA) 500 MG tablet Take 1 tablet (500 mg total) by mouth 2 (two) times daily. 60 tablet 11   ? Prenatal MV-Min-Fe Fum-FA-DHA (PRENATAL 1 PO) Take 1 tablet by mouth  daily. Reported on 12/26/2015 (Patient not taking: Reported on 09/12/2020)     ? senna-docusate (SENOKOT-S) 8.6-50 MG tablet Take 2 tablets by mouth daily. 30 tablet 0   ? sertraline (ZOLOFT) 100 MG tablet Take 100 mg by mouth daily.     ? witch hazel-glycerin (TUCKS) pad Apply 1 application. topically as needed for hemorrhoids (for pain). (Patient not taking: Reported on 11/13/2021) 40 each 12   ? ? ?Patient Stressors: Medication change or noncompliance   ?Other: Overwhelmed with family life    ? ?Patient Strengths: Ability for insight  ?General fund of knowledge  ?Supportive family/friends  ? ?Treatment Modalities: Medication Management, Group therapy, Case management,  ?1 to 1 session with clinician, Psychoeducation, Recreational therapy. ? ? ?Physician Treatment Plan for Primary Diagnosis: Suicide attempt [facility]) ?Long Term Goal(s):    ? ?Short Term Goals:   ? ?Medication Management: Evaluate patient's response, side effects, and tolerance of medication regimen. ? ?Therapeutic Interventions: 1 to 1 sessions, Unit Group sessions and Medication administration. ? ?Evaluation of Outcomes: Not Met ? ?Physician Treatment Plan for Secondary Diagnosis: Principal Problem: ?  Suicide attempt [facility]) ? ?Long Term Goal(s):    ? ?Short Term Goals:      ? ?Medication Management: Evaluate patient's response, side effects, and tolerance of medication regimen. ? ?  Therapeutic Interventions: 1 to 1 sessions, Unit Group sessions and Medication administration. ? ?Evaluation of Outcomes: Not Met ? ? ?RN Treatment Plan for Primary Diagnosis: Suicide attempt [facility]) ?Long Term Goal(s): Knowledge of disease and therapeutic regimen to maintain health will improve ? ?Short Term Goals: Ability to remain free from injury will improve, Ability to verbalize frustration and anger appropriately will improve, Ability to demonstrate self-control, Ability to participate in decision making will improve, Ability to verbalize feelings will improve, Ability to  disclose and discuss suicidal ideas, Ability to identify and develop effective coping behaviors will improve, and Compliance with prescribed medications will improve ? ?Medication Management: RN will administer medications as ordered by provider, will assess and evaluate patient's response and provide education to patient for prescribed medication. RN will report any adverse and/or side effects to prescribing provider. ? ?Therapeutic Interventions: 1 on 1 counseling sessions, Psychoeducation, Medication administration, Evaluate responses to treatment, Monitor vital signs and CBGs as ordered, Perform/monitor CIWA, COWS, AIMS and Fall Risk screenings as ordered, Perform wound care treatments as ordered. ? ?Evaluation of Outcomes: Not Met ? ? ?LCSW Treatment Plan for Primary Diagnosis: Suicide attempt [facility]) ?Long Term Goal(s): Safe transition to appropriate next level of care at discharge, Engage patient in therapeutic group addressing interpersonal concerns. ? ?Short Term Goals: Engage patient in aftercare planning with referrals and resources, Increase social support, Increase ability to appropriately verbalize feelings, Increase emotional regulation, Facilitate acceptance of mental health diagnosis and concerns, Facilitate patient progression through stages of change regarding substance use diagnoses and concerns, Identify triggers associated with mental health/substance abuse issues, and Increase skills for wellness and recovery ? ?Therapeutic Interventions: Assess for all discharge needs, 1 to 1 time with Education officer, museum, Explore available resources and support systems, Assess for adequacy in community support network, Educate family and significant other(s) on suicide prevention, Complete Psychosocial Assessment, Interpersonal group therapy. ? ?Evaluation of Outcomes: Not Met ? ? ?Progress in Treatment: ?Attending groups: No. ?Participating in groups: No. ?Taking medication as prescribed: Yes. ?Toleration  medication: Yes. ?Family/Significant other contact made: No, will contact:  CSW will obtain consent for CSW to reach collateral.  ?Patient understands diagnosis: Yes. ?Discussing patient identified problems/goals with staff: Yes. ?Medical problems stabilized or resolved: Yes. ?Denies suicidal/homicidal ideation: No. ?Issues/concerns per patient self-inventory: Yes. ?Other: none ? ?New problem(s) identified: No, Describe:  No additional concerns/problems identified at this time.  ? ?New Short Term/Long Term Goal(s): Patient to work towards medication management for mood stabilization; elimination of SI thoughts; development of comprehensive mental wellness plan. ? ?Patient Goals: States. "A little more patience."  ? ?Discharge Plan or Barriers: No psychosocial barriers identified at this time, patient to return to place of residence when appropriate for discharge.  ? ?Reason for Continuation of Hospitalization: Depression ?Suicidal ideation ? ?Estimated Length of Stay: 1-7 days  ? ?Last 3 [place] Suicide Severity Risk Score: ?[place] Admission (Current) from 11/14/2021 in [place] ED from 11/13/2021 in [place]  ?C-SSRS RISK CATEGORY No Risk No Risk  ? ?  ? ? ?Last PHQ 2/9 Scores: ? ?  03/01/2016  ?  1:55 PM  ?Depression screen PHQ 2/9  ?Decreased Interest 0  ?Down, Depressed, Hopeless 0  ?PHQ - 2 Score 0  ?Altered sleeping 0  ?Tired, decreased energy 0  ?Change in appetite 0  ?Feeling bad or failure about yourself  0  ?Trouble concentrating 0  ?Moving slowly or fidgety/restless 0  ?Suicidal thoughts 0  ?PHQ-9  Score 0  ? ? ?Scribe for Treatment Team: ?Larose Kells ?11/15/2021 ?11:12 AM ? ? ?

## 2021-11-15 NOTE — Plan of Care (Signed)
Patient pleasant and cooperative on approach. Isolates to her room most of this shift. Patient stated that she need to have more patience with kids and get more strong with her emotions. Denies SI,HI and AVH. Patient stated that her husband and mom are supportive. Appetite and energy level good. Compliant with medications. Attended group. Support and encouragement given. ?

## 2021-11-15 NOTE — H&P (Signed)
Psychiatric Admission Assessment Adult ? ?Patient Identification: Kathleen Mcgrath ?MRN:  [id number] ?Date of Evaluation:  11/15/2021 ?Chief Complaint:  Suicide attempt Kathleen Mcgrath [facility](HCC) [T14.91XA] ?Principal Diagnosis: MDD (major depressive disorder), recurrent episode, severe (HCC) ?Diagnosis:  Principal Problem: ?  MDD (major depressive disorder), recurrent episode, severe (HCC) ?Active Problems: ?  Suicide attempt [facility](HCC) ? ?History of Present Illness: Patient seen and chart reviewed.  26 year old woman brought to the emergency room after taking an overdose of sertraline.  Patient says that she has been feeling very stressed out and depressed.  She just gave birth to a child about 5 days ago but was feeling depressed during the pregnancy as well.  Feels like all of the burden of homemaking falls on her at home.  Not sleeping well at night.  Appetite decreased.  Admits to having had suicidal thoughts when she took Zoloft but did tell a friend facilitating coming to the hospital.  Denies that she has been using alcohol or any other drugs.  She has been on the Zoloft which has been in place for nearly a year.  Patient has a history of a past seizure disorder and is on Keppra and lamotrigine regularly.  Denies homicidal ideation.  Denies hallucinations or other psychotic symptoms ?Associated Signs/Symptoms: ?Depression Symptoms:  depressed mood, ?psychomotor retardation, ?suicidal attempt, ?loss of energy/fatigue, ?disturbed sleep, ?Duration of Depression Symptoms: No data recorded ?(Hypo) Manic Symptoms:   None reported ?Anxiety Symptoms:  Excessive Worry, ?Psychotic Symptoms:   None ?PTSD Symptoms: ?Negative ?Total Time spent with patient: 1 hour ? ?Past Psychiatric History: Past history of depression with previous pregnancy.  She has 4 children now.  One of them a newborn one of them born just 11 months ago.  Had been taking medicine for depression ever since that last child was born.  No previous suicide attempts.  No previous  hospitalizations.  The sertraline she is taking is the only medicine she has been on. ? ?Is the patient at risk to self? Yes.    ?Has the patient been a risk to self in the past 6 months? Yes.    ?Has the patient been a risk to self within the distant past? Yes.    ?Is the patient a risk to others? No.  ?Has the patient been a risk to others in the past 6 months? No.  ?Has the patient been a risk to others within the distant past? No.  ? ?Prior Inpatient Therapy:   ?Prior Outpatient Therapy:   ? ?Alcohol Screening: Patient refused Alcohol Screening Tool: Yes ?1. How often do you have a drink containing alcohol?: Never ?2. How many drinks containing alcohol do you have on a typical day when you are drinking?: 1 or 2 ?3. How often do you have six or more drinks on one occasion?: Never ?AUDIT-C Score: 0 ?4. How often during the last year have you found that you were not able to stop drinking once you had started?: Never ?5. How often during the last year have you failed to do what was normally expected from you because of drinking?: Never ?6. How often during the last year have you needed a first drink in the morning to get yourself going after a heavy drinking session?: Never ?7. How often during the last year have you had a feeling of guilt of remorse after drinking?: Never ?8. How often during the last year have you been unable to remember what happened the night before because you had been drinking?: Never ?9. Have  you or someone else been injured as a result of your drinking?: No ?10. Has a relative or friend or a doctor or another health worker been concerned about your drinking or suggested you cut down?: No ?Alcohol Use Disorder Identification Test Final Score (AUDIT): 0 ?Substance Abuse History in the last 12 months:  No. ?Consequences of Substance Abuse: ?Negative ?Previous Psychotropic Medications: Yes  ?Psychological Evaluations: Yes  ?Past Medical History:  ?Past Medical History:  ?Diagnosis Date  ?  Seizures (HCC)   ? Seizures (HCC)   ?  ?Past Surgical History:  ?Procedure Laterality Date  ? NO PAST SURGERIES    ? ?Family History:  ?Family History  ?Problem Relation Age of Onset  ? Diabetes Mother   ? Hypertension Mother   ? Hypertension Father   ? Breast cancer Maternal Grandmother   ? Autism Paternal Grandfather   ? Seizures Cousin   ?     maternal  ? Stroke Cousin   ?     maternal  ? ?Family Psychiatric  History: Thinks she may have had a family member with depression but no family history of suicide ?Tobacco Screening:   ?Social History:  ?Social History  ? ?Substance and Sexual Activity  ?Alcohol Use No  ? Alcohol/week: 0.0 standard drinks  ?   ?Social History  ? ?Substance and Sexual Activity  ?Drug Use No  ?  ?Additional Social History: ?  ?   ?  ?  ?  ?  ?  ?  ?  ?  ?  ?  ? ?Allergies:   ?Allergies  ?Allergen Reactions  ? Phenytoin Anaphylaxis and Itching  ? ?Lab Results:  ?Results for orders placed or performed during the hospital encounter of 11/13/21 (from the past 48 hour(s))  ?Comprehensive metabolic panel     Status: Abnormal  ? Collection Time: 11/13/21  5:54 PM  ?Result Value Ref Range  ? Sodium 138 135 - 145 mmol/L  ? Potassium 3.7 3.5 - 5.1 mmol/L  ? Chloride 106 98 - 111 mmol/L  ? CO2 23 22 - 32 mmol/L  ? Glucose, Bld 99 70 - 99 mg/dL  ?  Comment: Glucose reference range applies only to samples taken after fasting for at least 8 hours.  ? BUN 9 6 - 20 mg/dL  ? Creatinine, Ser 0.67 0.44 - 1.00 mg/dL  ? Calcium 8.9 8.9 - 10.3 mg/dL  ? Total Protein 7.5 6.5 - 8.1 g/dL  ? Albumin 3.4 (L) 3.5 - 5.0 g/dL  ? AST 25 15 - 41 U/L  ? ALT 22 0 - 44 U/L  ? Alkaline Phosphatase 123 38 - 126 U/L  ? Total Bilirubin 0.5 0.3 - 1.2 mg/dL  ? GFR, Estimated >60 >60 mL/min  ?  Comment: (NOTE) ?Calculated using the CKD-EPI Creatinine Equation (2021) ?  ? Anion gap 9 5 - 15  ?  Comment: Performed at [facility], [address], [place], [place] [address]  ?cbc     Status: Abnormal  ? Collection Time:  11/13/21  5:54 PM  ?Result Value Ref Range  ? WBC 8.2 4.0 - 10.5 K/uL  ? RBC 4.92 3.87 - 5.11 MIL/uL  ? Hemoglobin 11.4 (L) 12.0 - 15.0 g/dL  ? HCT 38.7 36.0 - 46.0 %  ? MCV 78.7 (L) 80.0 - 100.0 fL  ? MCH 23.2 (L) 26.0 - 34.0 pg  ? MCHC 29.5 (L) 30.0 - 36.0 g/dL  ? RDW 19.5 (H) 11.5 - 15.5 %  ?  Platelets 318 150 - 400 K/uL  ? nRBC 0.0 0.0 - 0.2 %  ?  Comment: Performed at [facility], [address], [place], [place] [address]  ?Urine Drug Screen, Qualitative     Status: None  ? Collection Time: 11/13/21  5:54 PM  ?Result Value Ref Range  ? Tricyclic, Ur Screen NONE DETECTED NONE DETECTED  ? Amphetamines, Ur Screen NONE DETECTED NONE DETECTED  ? MDMA (Ecstasy)Ur Screen NONE DETECTED NONE DETECTED  ? Cocaine Metabolite,Ur [place] NONE DETECTED NONE DETECTED  ? Opiate, Ur Screen NONE DETECTED NONE DETECTED  ? Phencyclidine (PCP) Ur S NONE DETECTED NONE DETECTED  ? Cannabinoid 50 Ng, Ur [place] NONE DETECTED NONE DETECTED  ? Barbiturates, Ur Screen NONE DETECTED NONE DETECTED  ? Benzodiazepine, Ur Scrn NONE DETECTED NONE DETECTED  ? Methadone Scn, Ur NONE DETECTED NONE DETECTED  ?  Comment: (NOTE) ?Tricyclics + metabolites, urine    Cutoff 1000 ng/mL ?Amphetamines + metabolites, urine  Cutoff 1000 ng/mL ?MDMA (Ecstasy), urine              Cutoff 500 ng/mL ?Cocaine Metabolite, urine          Cutoff 300 ng/mL ?Opiate + metabolites, urine        Cutoff 300 ng/mL ?Phencyclidine (PCP), urine         Cutoff 25 ng/mL ?Cannabinoid, urine                 Cutoff 50 ng/mL ?Barbiturates + metabolites, urine  Cutoff 200 ng/mL ?Benzodiazepine, urine              Cutoff 200 ng/mL ?Methadone, urine                   Cutoff 300 ng/mL ? ?The urine drug screen provides only a preliminary, unconfirmed ?analytical test result and should not be used for non-medical ?purposes. Clinical consideration and professional judgment should ?be applied to any positive drug screen result due to possible ?interfering substances. A more specific  alternate chemical method ?must be used in order to obtain a confirmed analytical result. ?Gas chromatography / mass spectrometry (GC/MS) is the preferred ?confirm atory method. ?Performed at [facility], [address]
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
## 2021-11-16 DIAGNOSIS — F332 Major depressive disorder, recurrent severe without psychotic features: Secondary | ICD-10-CM | POA: Diagnosis not present

## 2021-11-16 NOTE — Progress Notes (Signed)
[facility] MD Progress Note ? ?11/16/2021 3:13 PM ?Kathleen Mcgrath  ?MRN:  [id number] ?Subjective: Follow-up this 26 year old woman with depression.  Patient seen and chart reviewed.  Patient reports her mood is better.  Not feeling nearly as sad.  No suicidal thoughts today.  No new physical complaints. ?Principal Problem: MDD (major depressive disorder), recurrent episode, severe ([place]) ?Diagnosis: Principal Problem: ?  MDD (major depressive disorder), recurrent episode, severe ([place]) ?Active Problems: ?  Suicide attempt [facility]) ? ?Total Time spent with patient: 30 minutes ? ?Past Psychiatric History: Past history of depression postpartum before as well ? ?Past Medical History:  ?Past Medical History:  ?Diagnosis Date  ? Seizures ([place])   ? Seizures ([place])   ?  ?Past Surgical History:  ?Procedure Laterality Date  ? NO PAST SURGERIES    ? ?Family History:  ?Family History  ?Problem Relation Age of Onset  ? Diabetes Mother   ? Hypertension Mother   ? Hypertension Father   ? Breast cancer Maternal Grandmother   ? Autism Paternal Grandfather   ? Seizures Cousin   ?     maternal  ? Stroke Cousin   ?     maternal  ? ?Family Psychiatric  History: See previous ?Social History:  ?Social History  ? ?Substance and Sexual Activity  ?Alcohol Use No  ? Alcohol/week: 0.0 standard drinks  ?   ?Social History  ? ?Substance and Sexual Activity  ?Drug Use No  ?  ?Social History  ? ?Socioeconomic History  ? Marital status: Significant Other  ?  Spouse name: Ovid Curd  ? Number of children: Not on file  ? Years of education: Not on file  ? Highest education level: Not on file  ?Occupational History  ? Occupation: homemaker  ?Tobacco Use  ? Smoking status: Never  ? Smokeless tobacco: Never  ?Vaping Use  ? Vaping Use: Never used  ?Substance and Sexual Activity  ? Alcohol use: No  ?  Alcohol/week: 0.0 standard drinks  ? Drug use: No  ? Sexual activity: Yes  ?  Partners: Male  ?  Birth control/protection: None  ?Other Topics Concern  ? Not on file  ?Social  History Narrative  ? Not on file  ? ?Social Determinants of Health  ? ?Financial Resource Strain: Not on file  ?Food Insecurity: Not on file  ?Transportation Needs: Not on file  ?Physical Activity: Not on file  ?Stress: Not on file  ?Social Connections: Not on file  ? ?Additional Social History:  ?  ?  ?  ?  ?  ?  ?  ?  ?  ?  ?  ? ?Sleep: Fair ? ?Appetite:  Fair ? ?Current Medications: ?Current Facility-Administered Medications  ?Medication Dose Route Frequency Provider Last Rate Last Admin  ? acetaminophen (TYLENOL) tablet 650 mg  650 mg Oral Q6H PRN Sherlon Handing, NP      ? alum & mag hydroxide-simeth (MAALOX/MYLANTA) 200-200-20 MG/5ML suspension 30 mL  30 mL Oral Q4H PRN Waldon Merl F, NP      ? FLUoxetine (PROZAC) capsule 20 mg  20 mg Oral Daily Hildreth Robart T, MD   20 mg at 11/16/21 [id number]  ? hydrOXYzine (ATARAX) tablet 25 mg  25 mg Oral TID PRN Sherlon Handing, NP      ? lamoTRIgine (LAMICTAL) tablet 200 mg  200 mg Oral BID Waldon Merl F, NP   200 mg at 11/16/21 [id number]  ? levETIRAcetam (KEPPRA) tablet 500 mg  500 mg Oral BID  Sherlon Handing, NP   500 mg at 11/16/21 0820  ? magnesium hydroxide (MILK OF MAGNESIA) suspension 30 mL  30 mL Oral Daily PRN Sherlon Handing, NP      ? ? ?Lab Results: No results found for this or any previous visit (from the past 48 hour(s)). ? ?Blood Alcohol level:  ?Lab Results  ?Component Value Date  ? ETH <10 11/13/2021  ? ? ?Metabolic Disorder Labs: ?No results found for: HGBA1C, MPG ?No results found for: PROLACTIN ?No results found for: CHOL, TRIG, HDL, CHOLHDL, VLDL, LDLCALC ? ?Physical Findings: ?AIMS:  , ,  ,  ,    ?CIWA:    ?COWS:    ? ?Musculoskeletal: ?Strength & Muscle Tone: within normal limits ?Gait & Station: normal ?Patient leans: N/A ? ?Psychiatric Specialty Exam: ? ?Presentation  ?General Appearance: Appropriate for Environment ? ?Eye Contact:Good ? ?Speech:Clear and Coherent ? ?Speech Volume:Decreased ? ?Handedness:Right ? ? ?Mood and Affect   ?Mood:Depressed; Anxious ? ?Affect:Blunt; Depressed; Tearful ? ? ?Thought Process  ?Thought Processes:Coherent ? ?Descriptions of Associations:Intact ? ?Orientation:Full (Time, Place and Person) ? ?Thought Content:Logical ? ?History of Schizophrenia/Schizoaffective disorder:No data recorded ?Duration of Psychotic Symptoms:No data recorded ?Hallucinations:No data recorded ?Ideas of Reference:None ? ?Suicidal Thoughts:No data recorded ?Homicidal Thoughts:No data recorded ? ?Sensorium  ?Memory:Immediate Good; Recent Good; Remote Good ? ?Judgment:Poor ? ?Insight:Poor ? ? ?Executive Functions  ?Concentration:Fair ? ?Attention Span:Fair ? ?Recall:No data recorded ?[employer] [place] ? ?Language:Good ? ? ?Psychomotor Activity  ?Psychomotor Activity:No data recorded ? ?Assets  ?Assets:Communication Skills; Desire for Improvement; Leisure Time; Resilience; Social Support ? ? ?Sleep  ?Sleep:No data recorded ? ? ?Physical Exam: ?Physical Exam ?Vitals and nursing note reviewed.  ?Constitutional:   ?   Appearance: Normal appearance.  ?HENT:  ?   Head: Normocephalic and atraumatic.  ?   Mouth/Throat:  ?   Pharynx: Oropharynx is clear.  ?Eyes:  ?   Pupils: Pupils are equal, round, and reactive to light.  ?Cardiovascular:  ?   Rate and Rhythm: Normal rate and regular rhythm.  ?Pulmonary:  ?   Effort: Pulmonary effort is normal.  ?   Breath sounds: Normal breath sounds.  ?Abdominal:  ?   General: Abdomen is flat.  ?   Palpations: Abdomen is soft.  ?Musculoskeletal:     ?   General: Normal range of motion.  ?Skin: ?   General: Skin is warm and dry.  ?Neurological:  ?   General: No focal deficit present.  ?   Mental Status: She is alert. Mental status is at baseline.  ?Psychiatric:     ?   Attention and Perception: Attention normal.     ?   Mood and Affect: Mood normal.     ?   Speech: Speech normal.     ?   Behavior: Behavior is cooperative.     ?   Thought Content: Thought content normal.     ?   Cognition and Memory: Cognition  normal.     ?   Judgment: Judgment normal.  ? ?Review of Systems  ?Constitutional: Negative.   ?HENT: Negative.    ?Eyes: Negative.   ?Respiratory: Negative.    ?Cardiovascular: Negative.   ?Gastrointestinal: Negative.   ?Musculoskeletal: Negative.   ?Skin: Negative.   ?Neurological: Negative.   ?Psychiatric/Behavioral: Negative.    ?Blood pressure 119/78, pulse 97, temperature 98.2 ?F (36.8 ?C), temperature source Oral, resp. rate 18, height 5\' 2"  (1.575 m), weight 86.6 kg, SpO2 100 %, not  currently breastfeeding. Body mass index is 34.93 kg/m?. ? ? ?Treatment Plan Summary: ?Medication management and Plan no change to medication management.  Supportive counseling.  Reviewed treatment.  Possibly ready for discharge tomorrow. ? ?Alethia Berthold, MD ?11/16/2021, 3:13 PM ? ?

## 2021-11-16 NOTE — Progress Notes (Signed)
Patient came to the nurses station asking for "something for a headache", and maternity pads. Patient was given PRN Tylenol and the pads from this writer. Patient tolerated medication administration well, without any issues. Patient remains safe on the unit. ?

## 2021-11-16 NOTE — Progress Notes (Signed)
Patient pleasant and cooperative. She is easy to engage in conversation. She denies si hi avh depression and anxiety at this encounter. She has been isolative to her room but engages well with all when approached.  She has no qhs meds to take and states that she does not need any medication to help aid in sleep.  Encouraged her to seek staff with any concerns.  Will continue to monitor with q15 minute safety checks.   ? ? ?  ? ?C Butler-Nicholson, LPN ?

## 2021-11-16 NOTE — Plan of Care (Signed)
D- Patient alert and oriented. Patient presents in a pleasant mood on assessment stating that she slept good last night and had no complaints to voice to this Clinical research associate. Patient denies SI, HI, AVH, and pain at this time. Patient also denies any signs/symptoms of depression/anxiety, reporting that overall she is feeling "good". Patient had no stated goals for today. ? ?A- Scheduled medications administered to patient, per MD orders. Support and encouragement provided.  Routine safety checks conducted every 15 minutes.  Patient informed to notify staff with problems or concerns. ? ?R- No adverse drug reactions noted. Patient contracts for safety at this time. Patient compliant with medications and treatment plan. Patient receptive, calm, and cooperative. Patient isolates to room, except for meals and medication times. Patient remains safe at this time. ? ?Problem: Education: ?Goal: Knowledge of [facility] Education information/materials will improve ?Outcome: Progressing ?Goal: Emotional status will improve ?Outcome: Progressing ?Goal: Mental status will improve ?Outcome: Progressing ?Goal: Verbalization of understanding the information provided will improve ?Outcome: Progressing ?  ?Problem: Health Behavior/Discharge Planning: ?Goal: Compliance with treatment plan for underlying cause of condition will improve ?Outcome: Progressing ?  ?Problem: Safety: ?Goal: Periods of time without injury will increase ?Outcome: Progressing ?  ?Problem: Coping: ?Goal: Ability to identify and develop effective coping behavior will improve ?Outcome: Progressing ?  ?Problem: Self-Concept: ?Goal: Level of anxiety will decrease ?Outcome: Progressing ?  ?Problem: Coping: ?Goal: Coping ability will improve ?Outcome: Progressing ?Goal: Will verbalize feelings ?Outcome: Progressing ?  ?Problem: Health Behavior/Discharge Planning: ?Goal: Compliance with therapeutic regimen will improve ?Outcome: Progressing ?  ?Problem:  Self-Concept: ?Goal: Level of anxiety will decrease ?Outcome: Progressing ?  ?Problem: Education: ?Goal: Ability to make informed decisions regarding treatment will improve ?Outcome: Progressing ?  ?Problem: Coping: ?Goal: Coping ability will improve ?Outcome: Progressing ?  ?Problem: Health Behavior/Discharge Planning: ?Goal: Identification of resources available to assist in meeting health care needs will improve ?Outcome: Progressing ?  ?Problem: Self-Concept: ?Goal: Will verbalize positive feelings about self ?Outcome: Progressing ?  ?

## 2021-11-16 NOTE — Plan of Care (Signed)
?  Problem: Education: ?Goal: Knowledge of [facility] Education information/materials will improve ?Outcome: Progressing ?Goal: Emotional status will improve ?Outcome: Progressing ?Goal: Mental status will improve ?Outcome: Progressing ?Goal: Verbalization of understanding the information provided will improve ?Outcome: Progressing ?  ?Problem: Health Behavior/Discharge Planning: ?Goal: Compliance with treatment plan for underlying cause of condition will improve ?Outcome: Progressing ?  ?Problem: Safety: ?Goal: Periods of time without injury will increase ?Outcome: Progressing ?  ?Problem: Coping: ?Goal: Ability to identify and develop effective coping behavior will improve ?Outcome: Progressing ?  ?Problem: Self-Concept: ?Goal: Level of anxiety will decrease ?Outcome: Progressing ?  ?Problem: Coping: ?Goal: Coping ability will improve ?Outcome: Progressing ?Goal: Will verbalize feelings ?Outcome: Progressing ?  ?Problem: Self-Concept: ?Goal: Will verbalize positive feelings about self ?Outcome: Progressing ?  ?Problem: Health Behavior/Discharge Planning: ?Goal: Identification of resources available to assist in meeting health care needs will improve ?Outcome: Progressing ?  ?

## 2021-11-16 NOTE — Progress Notes (Signed)
Patient was in the medication room, receiving her scheduled evening meds. It appeared as if patient took some of her medication and then she put her left hand down, as if she put it in her pocket. This Clinical research associate asked patient if she took medication and she stated that she did. So, this Clinical research associate asked patient if she took her Keppra or did she put it in her pocket. Patient stated that she did take it. This Clinical research associate asked if I could check in her pocket and she agreed. This Clinical research associate did not find any medication in patient's left pocket. Patient stated "I have to take Keppra, if I don't, it makes me feel funny". This Clinical research associate explained to patient that I was just making sure that she was taking her medication. This Clinical research associate will pass this along to oncoming staff, just to be certain staff isn't missing anything.  ?

## 2021-11-16 NOTE — Group Note (Signed)
[facility] LCSW Group Therapy Note ? ? ?Group Date: 11/16/2021 ?Start Time: 1400 ?End Time: 1500 ? ? ?Type of Therapy/Topic:  Group Therapy:  Balance in Life ? ?Participation Level:  Minimal  ? ?Description of Group:   ? This group will address the concept of balance and how it feels and looks when one is unbalanced. Patients will be encouraged to process areas in their lives that are out of balance, and identify reasons for remaining unbalanced. Facilitators will guide patients utilizing problem- solving interventions to address and correct the stressor making their life unbalanced. Understanding and applying boundaries will be explored and addressed for obtaining  and maintaining a balanced life. Patients will be encouraged to explore ways to assertively make their unbalanced needs known to significant others in their lives, using other group members and facilitator for support and feedback. ? ?Therapeutic Goals: ?Patient will identify two or more emotions or situations they have that consume much of in their lives. ?Patient will identify signs/triggers that life has become out of balance:  ?Patient will identify two ways to set boundaries in order to achieve balance in their lives:  ?Patient will demonstrate ability to communicate their needs through discussion and/or role plays ? ?Summary of Patient Progress: ?Patient was present for the entirety of the discussion. She participated in the opening icebreaker but outside of that she only responded when questions were directed to her. Pt stated that change causes her to become unbalanced. Although minimally involved she did appear to attend to the conversation and was able to respond when called on directly. She appeared open and receptive to feedback from facilitator and peers. ? ? ?Therapeutic Modalities:   ?Cognitive Behavioral Therapy ?Solution-Focused Therapy ?Assertiveness Training ? ? ?Shirl Harris, LCSW ?

## 2021-11-17 DIAGNOSIS — F332 Major depressive disorder, recurrent severe without psychotic features: Secondary | ICD-10-CM | POA: Diagnosis not present

## 2021-11-17 MED ORDER — LEVETIRACETAM 500 MG PO TABS: 500.0000 mg | ORAL_TABLET | Freq: Two times a day (BID) | ORAL | 1 refills | Status: AC

## 2021-11-17 MED ORDER — LAMOTRIGINE 200 MG PO TABS: 200.0000 mg | ORAL_TABLET | Freq: Two times a day (BID) | ORAL | 1 refills | Status: AC

## 2021-11-17 MED ORDER — HYDROXYZINE HCL 25 MG PO TABS
25.0000 mg | ORAL_TABLET | Freq: Three times a day (TID) | ORAL | 1 refills | Status: AC | PRN
Start: 2021-11-17 — End: ?

## 2021-11-17 MED ORDER — FLUOXETINE HCL 20 MG PO CAPS: 20.0000 mg | ORAL_CAPSULE | Freq: Every day | ORAL | 1 refills | Status: AC

## 2021-11-17 NOTE — Discharge Summary (Signed)
Physician Discharge Summary Note  Patient:  Kathleen Mcgrath is an 26 y.o., female MRN:  [id number] DOB:  [birth year] Patient phone:  [phone] (home)  Patient address:   [address] [place] [address],  Total Time spent with patient: 30 minutes  Date of Admission:  11/14/2021 Date of Discharge: 11/17/2021  Reason for Admission: Patient was admitted after presenting with an overdose of Zoloft with symptoms of depression and suicidal ideation  Principal Problem: MDD (major depressive disorder), recurrent episode, severe (HCC) Discharge Diagnoses: Principal Problem:   MDD (major depressive disorder), recurrent episode, severe (HCC) Active Problems:   Suicide attempt (HCC)   Past Psychiatric History: Past history of depression after her last pregnancy as well  Past Medical History:  Past Medical History:  Diagnosis Date   Seizures (HCC)    Seizures (HCC)     Past Surgical History:  Procedure Laterality Date   NO PAST SURGERIES     Family History:  Family History  Problem Relation Age of Onset   Diabetes Mother    Hypertension Mother    Hypertension Father    Breast cancer Maternal Grandmother    Autism Paternal Grandfather    Seizures Cousin        maternal   Stroke Cousin        maternal   Family Psychiatric  History: See previous Social History:  Social History   Substance and Sexual Activity  Alcohol Use No   Alcohol/week: 0.0 standard drinks     Social History   Substance and Sexual Activity  Drug Use No    Social History   Socioeconomic History   Marital status: Significant Other    Spouse name: Harrold Donath   Number of children: Not on file   Years of education: Not on file   Highest education level: Not on file  Occupational History   Occupation: homemaker  Tobacco Use   Smoking status: Never   Smokeless tobacco: Never  Vaping Use   Vaping Use: Never used  Substance and Sexual Activity   Alcohol use: No    Alcohol/week: 0.0 standard  drinks   Drug use: No   Sexual activity: Yes    Partners: Male    Birth control/protection: None  Other Topics Concern   Not on file  Social History Narrative   Not on file   Social Determinants of Health   Financial Resource Strain: Not on file  Food Insecurity: Not on file  Transportation Needs: Not on file  Physical Activity: Not on file  Stress: Not on file  Social Connections: Not on file    Hospital Course: Patient admitted to the hospital.  15-minute checks continued.  Patient did not display any dangerous violent or self-injuring behavior in the hospital.  She was cooperative and appropriate with the treatment team.  I suggested that we try a different antidepressant and started her on fluoxetine rather than sertraline.  She has tolerated that the last couple days.  She was continued on her normal seizure medicine.  At the time of discharge she denies any suicidal thoughts.  Denies psychotic symptoms.  States a willingness to follow up with appropriate outpatient mental health care.  Psychoeducation about depression symptoms and treatment has been provided.  Physical Findings: AIMS:  , ,  ,  ,    CIWA:    COWS:     Musculoskeletal: Strength & Muscle Tone: within normal limits Gait & Station: normal Patient leans: N/A   Psychiatric Specialty Exam:  Presentation  General Appearance: Appropriate for Environment  Eye Contact:Good  Speech:Clear and Coherent  Speech Volume:Decreased  Handedness:Right   Mood and Affect  Mood:Depressed; Anxious  Affect:Blunt; Depressed; Tearful   Thought Process  Thought Processes:Coherent  Descriptions of Associations:Intact  Orientation:Full (Time, Place and Person)  Thought Content:Logical  History of Schizophrenia/Schizoaffective disorder:No data recorded Duration of Psychotic Symptoms:No data recorded Hallucinations:No data recorded Ideas of Reference:None  Suicidal Thoughts:No data recorded Homicidal  Thoughts:No data recorded  Sensorium  Memory:Immediate Good; Recent Good; Remote Good  Judgment:Poor  Insight:Poor   Executive Functions  Concentration:Fair  Attention Span:Fair  Recall:No data recorded Fund of Knowledge:Good  Language:Good   Psychomotor Activity  Psychomotor Activity:No data recorded  Assets  Assets:Communication Skills; Desire for Improvement; Leisure Time; Resilience; Social Support   Sleep  Sleep:No data recorded   Physical Exam: Physical Exam Vitals and nursing note reviewed.  Constitutional:      Appearance: Normal appearance.  HENT:     Head: Normocephalic and atraumatic.     Mouth/Throat:     Pharynx: Oropharynx is clear.  Eyes:     Pupils: Pupils are equal, round, and reactive to light.  Cardiovascular:     Rate and Rhythm: Normal rate and regular rhythm.  Pulmonary:     Effort: Pulmonary effort is normal.     Breath sounds: Normal breath sounds.  Abdominal:     General: Abdomen is flat.     Palpations: Abdomen is soft.  Musculoskeletal:        General: Normal range of motion.  Skin:    General: Skin is warm and dry.  Neurological:     General: No focal deficit present.     Mental Status: She is alert. Mental status is at baseline.  Psychiatric:        Attention and Perception: Attention normal.        Mood and Affect: Mood normal.        Speech: Speech normal.        Behavior: Behavior normal.        Thought Content: Thought content normal.        Cognition and Memory: Cognition normal.        Judgment: Judgment normal.   Review of Systems  Constitutional: Negative.   HENT: Negative.    Eyes: Negative.   Respiratory: Negative.    Cardiovascular: Negative.   Gastrointestinal: Negative.   Musculoskeletal: Negative.   Skin: Negative.   Neurological: Negative.   Psychiatric/Behavioral: Negative.    Blood pressure 132/87, pulse (!) 103, temperature 98.2 F (36.8 C), temperature source Oral, resp. rate 18, height 5'  2" (1.575 m), weight 86.6 kg, SpO2 97 %, not currently breastfeeding. Body mass index is 34.93 kg/m.   Social History   Tobacco Use  Smoking Status Never  Smokeless Tobacco Never   Tobacco Cessation:  N/A, patient does not currently use tobacco products   Blood Alcohol level:  Lab Results  Component Value Date   ETH <10 11/13/2021    Metabolic Disorder Labs:  No results found for: HGBA1C, MPG No results found for: PROLACTIN No results found for: CHOL, TRIG, HDL, CHOLHDL, VLDL, LDLCALC  See Psychiatric Specialty Exam and Suicide Risk Assessment completed by Attending Physician prior to discharge.  Discharge destination:  Home  Is patient on multiple antipsychotic therapies at discharge:  No   Has Patient had three or more failed trials of antipsychotic monotherapy by history:  No  Recommended Plan for Multiple Antipsychotic Therapies: NA  Discharge Instructions     Diet - low sodium heart healthy   Complete by: As directed    Increase activity slowly   Complete by: As directed       Allergies as of 11/17/2021       Reactions   Phenytoin Anaphylaxis, Itching        Medication List     STOP taking these medications    acetaminophen 325 MG tablet Commonly known as: Tylenol   benzocaine-Menthol 20-0.5 % Aero Commonly known as: DERMOPLAST   coconut oil Oil   ibuprofen 600 MG tablet Commonly known as: ADVIL   PRENATAL 1 PO   senna-docusate 8.6-50 MG tablet Commonly known as: Senokot-S   sertraline 100 MG tablet Commonly known as: ZOLOFT   witch hazel-glycerin pad Commonly known as: TUCKS       TAKE these medications      Indication  FLUoxetine 20 MG capsule Commonly known as: PROZAC Take 1 capsule (20 mg total) by mouth daily. Start taking on: Nov 18, 2021  Indication: Depression   hydrOXYzine 25 MG tablet Commonly known as: ATARAX Take 1 tablet (25 mg total) by mouth 3 (three) times daily as needed for anxiety.  Indication: Feeling  Anxious   lamoTRIgine 200 MG tablet Commonly known as: LAMICTAL Take 1 tablet (200 mg total) by mouth 2 (two) times daily.  Indication: Epilepsy   levETIRAcetam 500 MG tablet Commonly known as: Keppra Take 1 tablet (500 mg total) by mouth 2 (two) times daily.  Indication: Seizure        Follow-up Information     [facility] on 11/24/2021.   Why: Please present for scheduled INPERSON appointment on 1100 AM on 11/24/2021. Contact information: [address] [place] [address] [phone]                 Follow-up recommendations: Follow-up referral to [facility].  Continue current medicine  Comments: Prescriptions provided  Signed: Mordecai Rasmussen, MD 11/17/2021, 10:17 AM

## 2021-11-17 NOTE — Plan of Care (Signed)
D: Pt alert and oriented. Pt denies experiencing any anxiety/depression at this time. Pt denies experiencing any pain at this time. Pt denies experiencing any SI/HI, or AVH at this time.  ? ?A: Scheduled medications administered to pt, per MD orders. Support and encouragement provided. Frequent verbal contact made. Routine safety checks conducted q15 minutes.  ? ?R: No adverse drug reactions noted. Pt verbally contracts for safety at this time. Pt compliant with medications and treatment plan. Pt interacts well with others on the unit. Pt remains safe at this time. Will continue to monitor.  ? ?Problem: Education: ?Goal: Emotional status will improve ?Outcome: Progressing ?Goal: Mental status will improve ?Outcome: Progressing ?  ?

## 2021-11-17 NOTE — Progress Notes (Signed)
Recreation Therapy Notes ? ?Date: 11/17/2021 ? ?Time: 10:40 am   ? ?Location: Craft room  ? ?Behavioral response: Appropriate ? ?Intervention Topic:  Stress Management   ? ?Discussion/Intervention:  ?Group content on today was focused on stress. The group defined stress and way to cope with stress. Participants expressed how they know when they are stresses out. Individuals described the different ways they have to cope with stress. The group stated reasons why it is important to cope with stress. Patient explained what good stress is and some examples. The group participated in the intervention ?Stress Management?. Individuals were separated into two group and answered questions related to stress.  ?Clinical Observations/Feedback: ?Patient came to group and was focused on what peers and staff had to say about stress management.  Individual was social with peers and staff while participating in the intervention.    ?Cerissa Zeiger LRT/CTRS  ? ? ? ? ? ? ? ?Adis Sturgill ?11/17/2021 12:51 PM ?

## 2021-11-17 NOTE — Plan of Care (Signed)
?  Problem: Education: ?Goal: Knowledge of [facility] Education information/materials will improve ?Outcome: Progressing ?Goal: Emotional status will improve ?Outcome: Progressing ?Goal: Mental status will improve ?Outcome: Progressing ?Goal: Verbalization of understanding the information provided will improve ?Outcome: Progressing ?  ?Problem: Health Behavior/Discharge Planning: ?Goal: Compliance with treatment plan for underlying cause of condition will improve ?Outcome: Progressing ?  ?Problem: Safety: ?Goal: Periods of time without injury will increase ?Outcome: Progressing ?  ?Problem: Self-Concept: ?Goal: Level of anxiety will decrease ?Outcome: Progressing ?  ?Problem: Coping: ?Goal: Coping ability will improve ?Outcome: Progressing ?Goal: Will verbalize feelings ?Outcome: Progressing ?  ?Problem: Health Behavior/Discharge Planning: ?Goal: Compliance with therapeutic regimen will improve ?Outcome: Progressing ?  ?Problem: Self-Concept: ?Goal: Level of anxiety will decrease ?Outcome: Progressing ?  ?Problem: Education: ?Goal: Ability to make informed decisions regarding treatment will improve ?Outcome: Progressing ?  ?Problem: Health Behavior/Discharge Planning: ?Goal: Identification of resources available to assist in meeting health care needs will improve ?Outcome: Progressing ?  ?

## 2021-11-17 NOTE — Progress Notes (Signed)
Patient has been isolative to her room this evening.  She has no QHS meds and reports not needing anything to help aid in sleep. She denies si hi avh depression anxiety and pain at this encounter.  Encouraged patient to seek staff with any questions or concerns. Will continue to monitor with q 15 minute safety checks. ? ? ? ?C Butler-Nicholson, LPN ?

## 2021-11-17 NOTE — Progress Notes (Signed)
D: Pt alert and oriented. Pt denies experiencing any pain, SI/HI, or AVH at this time. Pt reports she will be able to keep herself safe when she returns home.  ? ?A: Pt received discharge and medication education/information. Pt belongings were returned and signed for at this time to include printed prescriptions.  ? ?R: Pt verbalized understanding of discharge and medication education/information. ? ?Pt escorted by staff to the medical mall front lobby where pt was picked up by boyfriend.  ?

## 2021-11-17 NOTE — Progress Notes (Signed)
?  [facility] Adult Case Management Discharge Plan : ? ?Will you be returning to the same living situation after discharge:  Yes,  Patient to return to place of residence living with partner and four children.  ? ?At discharge, do you have transportation home?: Yes,  Patient's partner to provide transportation from hospital.  ? ?Do you have the ability to pay for your medications: Yes,  Medicaid Health Blue ? ?Release of information consent forms completed and in the chart;  Patient's signature needed at discharge. ? ?Patient to Follow up at: ? Follow-up Information   ? ? [facility] on 11/24/2021.   ?Why: Please present for scheduled INPERSON appointment on 1100 AM on 11/24/2021. ?Contact information: ?[address] ?[place] [address] ?[phone] ? ? ?  ?  ? ?  ?  ? ?  ? ? ?Next level of care provider has access to [facility] ? ?Safety Planning and Suicide Prevention discussed: Yes,  SPE completed with patient, declined consent for CSW to reach family/friend.  ?  ?Has patient been referred to the Quitline?: N/A patient is not a smoker ?Tobacco Use: Low Risk   ? Smoking Tobacco Use: Never  ? Smokeless Tobacco Use: Never  ? Passive Exposure: Not on file  ? ? ?Patient has been referred for addiction treatment: N/A Patient denies active substance use, UDS Negative for all, screened low risk during nursing admission (see [facility] quick tab).  ? ?Durenda Hurt, LCSWA ?11/17/2021, 10:03 AM ?

## 2021-11-17 NOTE — Progress Notes (Signed)
Patient approached nurses station reporting that she has started leaking breast milk.  Provided patient with new set of scrubs.  Patient states that she will not be breastfeeding. Will continue to monitor with q15 minute safety checks.  Encouraged her to seek staff if anything changes. ? ? ?C Butler-Nicholson, LPN ?

## 2021-11-17 NOTE — [facility] Suicide Risk Assessment (Signed)
[facility] Discharge Suicide Risk Assessment ? ? ?Principal Problem: MDD (major depressive disorder), recurrent episode, severe (HCC) ?Discharge Diagnoses: Principal Problem: ?  MDD (major depressive disorder), recurrent episode, severe (HCC) ?Active Problems: ?  Suicide attempt [facility]) ? ? ?Total Time spent with patient: 30 minutes ? ?Musculoskeletal: ?Strength & Muscle Tone: within normal limits ?Gait & Station: normal ?Patient leans: N/A ? ?Psychiatric Specialty Exam ? ?Presentation  ?General Appearance: Appropriate for Environment ? ?Eye Contact:Good ? ?Speech:Clear and Coherent ? ?Speech Volume:Decreased ? ?Handedness:Right ? ? ?Mood and Affect  ?Mood:Depressed; Anxious ? ?Duration of Depression Symptoms: No data recorded ?Affect:Blunt; Depressed; Tearful ? ? ?Thought Process  ?Thought Processes:Coherent ? ?Descriptions of Associations:Intact ? ?Orientation:Full (Time, Place and Person) ? ?Thought Content:Logical ? ?History of Schizophrenia/Schizoaffective disorder:No data recorded ?Duration of Psychotic Symptoms:No data recorded ?Hallucinations:No data recorded ?Ideas of Reference:None ? ?Suicidal Thoughts:No data recorded ?Homicidal Thoughts:No data recorded ? ?Sensorium  ?Memory:Immediate Good; Recent Good; Remote Good ? ?Judgment:Poor ? ?Insight:Poor ? ? ?Executive Functions  ?Concentration:Fair ? ?Attention Span:Fair ? ?Recall:No data recorded ?Fund of Knowledge:Good ? ?Language:Good ? ? ?Psychomotor Activity  ?Psychomotor Activity:No data recorded ? ?Assets  ?Assets:Communication Skills; Desire for Improvement; Leisure Time; Resilience; Social Support ? ? ?Sleep  ?Sleep:No data recorded ? ?Physical Exam: ?Physical Exam ?Vitals and nursing note reviewed.  ?Constitutional:   ?   Appearance: Normal appearance.  ?HENT:  ?   Head: Normocephalic and atraumatic.  ?   Mouth/Throat:  ?   Pharynx: Oropharynx is clear.  ?Eyes:  ?   Pupils: Pupils are equal, round, and reactive to light.  ?Cardiovascular:  ?   Rate and  Rhythm: Normal rate and regular rhythm.  ?Pulmonary:  ?   Effort: Pulmonary effort is normal.  ?   Breath sounds: Normal breath sounds.  ?Abdominal:  ?   General: Abdomen is flat.  ?   Palpations: Abdomen is soft.  ?Musculoskeletal:     ?   General: Normal range of motion.  ?Skin: ?   General: Skin is warm and dry.  ?Neurological:  ?   General: No focal deficit present.  ?   Mental Status: She is alert. Mental status is at baseline.  ?Psychiatric:     ?   Attention and Perception: Attention normal.     ?   Mood and Affect: Mood normal.     ?   Speech: Speech normal.     ?   Behavior: Behavior is cooperative.     ?   Thought Content: Thought content normal.     ?   Cognition and Memory: Cognition normal.     ?   Judgment: Judgment normal.  ? ?Review of Systems  ?Constitutional: Negative.   ?HENT: Negative.    ?Eyes: Negative.   ?Respiratory: Negative.    ?Cardiovascular: Negative.   ?Gastrointestinal: Negative.   ?Musculoskeletal: Negative.   ?Skin: Negative.   ?Neurological: Negative.   ?Psychiatric/Behavioral: Negative.    ?Blood pressure 132/87, pulse (!) 103, temperature 98.2 ?F (36.8 ?C), temperature source Oral, resp. rate 18, height 5\' 2"  (1.575 m), weight 86.6 kg, SpO2 97 %, not currently breastfeeding. Body mass index is 34.93 kg/m?. ? ?Mental Status Per Nursing Assessment::   ?On Admission:  NA ? ?Demographic Factors:  ?Caucasian ? ?Loss Factors: ?Financial problems/change in socioeconomic status ? ?Historical Factors: ?Impulsivity ? ?Risk Reduction Factors:   ?Responsible for children under 33 years of age, Sense of responsibility to family, Living with another person, especially a relative, Positive social support,  and Positive therapeutic relationship ? ?Continued Clinical Symptoms:  ?Depression:   Impulsivity ? ?Cognitive Features That Contribute To Risk:  ?None   ? ?Suicide Risk:  ?Minimal: No identifiable suicidal ideation.  Patients presenting with no risk factors but with morbid ruminations; may be  classified as minimal risk based on the severity of the depressive symptoms ? ? Follow-up Information   ? ? [employer] Go on 11/24/2021.   ?Why: Please present for scheduled INPERSON appointment on 1100 AM on 11/24/2021. ?Contact information: ?[address] ?[place] [address] ?[phone] ? ? ?  ?  ? ?  ?  ? ?  ? ? ?Plan Of Care/Follow-up recommendations:  ?Patient denies any suicidal or homicidal thoughts and has shown no behavior problems or self-injury in the hospital.  No report of any psychotic symptoms.  Tolerating medicine.  Agreeable to referral for outpatient follow-up. ? ?Mordecai Rasmussen, MD ?11/17/2021, 10:14 AM ?

## 2022-01-04 ENCOUNTER — Encounter
Admission: RE | Admit: 2022-01-04 | Discharge: 2022-01-04 | Disposition: A | Discharge status: Home / Self Care | Payer: Medicaid Other | Source: Ambulatory Visit | Attending: Obstetrics and Gynecology | Admitting: Obstetrics and Gynecology

## 2022-01-04 VITALS — Ht 62.0 in | Wt 195.8 lb

## 2022-01-04 DIAGNOSIS — Z01818 Encounter for other preprocedural examination: Secondary | ICD-10-CM

## 2022-01-04 HISTORY — DX: Anxiety disorder, unspecified: F41.9

## 2022-01-04 HISTORY — DX: Gastro-esophageal reflux disease without esophagitis: K21.9

## 2022-01-04 HISTORY — DX: Suicide attempt, initial encounter: T14.91XA

## 2022-01-04 HISTORY — DX: Anemia, unspecified: D64.9

## 2022-01-04 NOTE — Patient Instructions (Signed)
Your procedure is scheduled on:01-11-22 Thursday Report to the Registration Desk on the 1st floor of the [facility].Then proceed to the 2nd floor Surgery Desk To find out your arrival time, please call [phone] between 1PM - 3PM on:01-10-22 Wednesday If your arrival time is 6:00 am, do not arrive prior to that time as the [facility] entrance doors do not open until 6:00 am.  REMEMBER: Instructions that are not followed completely may result in serious medical risk, up to and including death; or upon the discretion of your surgeon and anesthesiologist your surgery may need to be rescheduled.  Do not eat food after midnight the night before surgery.  No gum chewing, lozengers or hard candies.  You may however, drink CLEAR liquids up to 2 hours before you are scheduled to arrive for your surgery. Do not drink anything within 2 hours of your scheduled arrival time.  Clear liquids include: - water  - apple juice without pulp - gatorade (not RED colors) - black coffee or tea (Do NOT add milk or creamers to the coffee or tea) Do NOT drink anything that is not on this list.  TAKE THESE MEDICATIONS THE MORNING OF SURGERY WITH A SIP OF WATER: -FLUoxetine (PROZAC) -lamoTRIgine (LAMICTAL)  -levETIRAcetam (KEPPRA)  -You may take hydrOXYzine (ATARAX) for anxiety if needed   One week prior to surgery: Stop Anti-inflammatories (NSAIDS) such as Advil, Aleve, Ibuprofen, Motrin, Naproxen, Naprosyn and Aspirin based products such as Excedrin, Goodys Powder, BC Powder.You may however, take Tylenol if needed for pain up until the day of surgery.  Stop ANY OVER THE COUNTER supplements/vitamins NOW (01-04-22) until after surgery.  No Alcohol for 24 hours before or after surgery.  No Smoking including e-cigarettes for 24 hours prior to surgery.  No chewable tobacco products for at least 6 hours prior to surgery.  No nicotine patches on the day of surgery.  Do not use any "recreational" drugs for  at least a week prior to your surgery.  Please be advised that the combination of cocaine and anesthesia may have negative outcomes, up to and including death. If you test positive for cocaine, your surgery will be cancelled.  On the morning of surgery brush your teeth with toothpaste and water, you may rinse your mouth with mouthwash if you wish. Do not swallow any toothpaste or mouthwash.  Use CHG Soap as directed on instruction sheet.  Do not wear jewelry, make-up, hairpins, clips or nail polish.  Do not wear lotions, powders, or perfumes.   Do not shave body from the neck down 48 hours prior to surgery just in case you cut yourself which could leave a site for infection.  Also, freshly shaved skin may become irritated if using the CHG soap.  Contact lenses, hearing aids and dentures may not be worn into surgery.  Do not bring valuables to the hospital. [facility] is not responsible for any missing/lost belongings or valuables.   Notify your doctor if there is any change in your medical condition (cold, fever, infection).  Wear comfortable clothing (specific to your surgery type) to the hospital.  After surgery, you can help prevent lung complications by doing breathing exercises.  Take deep breaths and cough every 1-2 hours. Your doctor may order a device called an Incentive Spirometer to help you take deep breaths. When coughing or sneezing, hold a pillow firmly against your incision with both hands. This is called "splinting." Doing this helps protect your incision. It also decreases belly discomfort.  If you are being admitted to the hospital overnight, leave your suitcase in the car. After surgery it may be brought to your room.  If you are being discharged the day of surgery, you will not be allowed to drive home. You will need a responsible adult (18 years or older) to drive you home and stay with you that night.   If you are taking public transportation, you will need to  have a responsible adult (18 years or older) with you. Please confirm with your physician that it is acceptable to use public transportation.   Please call the Pre-admissions Testing Dept. at [phone] if you have any questions about these instructions.  Surgery Visitation Policy:  Patients undergoing a surgery or procedure may have two family members or support persons with them as long as the person is not COVID-19 positive or experiencing its symptoms.

## 2022-01-11 ENCOUNTER — Other Ambulatory Visit: Payer: Self-pay

## 2022-01-11 ENCOUNTER — Encounter: Payer: Self-pay | Admitting: Obstetrics and Gynecology

## 2022-01-11 ENCOUNTER — Ambulatory Visit: Payer: Medicaid Other | Admitting: Anesthesiology

## 2022-01-11 ENCOUNTER — Encounter
Admission: RE | Disposition: A | Discharge status: Home / Self Care | Payer: Self-pay | Source: Ambulatory Visit | Attending: Obstetrics and Gynecology

## 2022-01-11 ENCOUNTER — Ambulatory Visit
Admission: RE | Admit: 2022-01-11 | Discharge: 2022-01-11 | Disposition: A | Discharge status: Home / Self Care | Payer: Medicaid Other | Source: Ambulatory Visit | Attending: Obstetrics and Gynecology | Admitting: Obstetrics and Gynecology

## 2022-01-11 DIAGNOSIS — Z6835 Body mass index (BMI) 35.0-35.9, adult: Secondary | ICD-10-CM | POA: Diagnosis not present

## 2022-01-11 DIAGNOSIS — E669 Obesity, unspecified: Secondary | ICD-10-CM | POA: Insufficient documentation

## 2022-01-11 DIAGNOSIS — F419 Anxiety disorder, unspecified: Secondary | ICD-10-CM | POA: Insufficient documentation

## 2022-01-11 DIAGNOSIS — F32A Depression, unspecified: Secondary | ICD-10-CM | POA: Insufficient documentation

## 2022-01-11 DIAGNOSIS — Z01818 Encounter for other preprocedural examination: Secondary | ICD-10-CM

## 2022-01-11 DIAGNOSIS — Z302 Encounter for sterilization: Secondary | ICD-10-CM | POA: Diagnosis present

## 2022-01-11 HISTORY — PX: LAPAROSCOPIC TUBAL LIGATION: SHX1937

## 2022-01-11 LAB — BASIC METABOLIC PANEL
Anion gap: 6 (ref 5–15)
BUN: 16 mg/dL (ref 6–20)
CO2: 27 mmol/L (ref 22–32)
Calcium: 8.9 mg/dL (ref 8.9–10.3)
Chloride: 107 mmol/L (ref 98–111)
Creatinine, Ser: 0.74 mg/dL (ref 0.44–1.00)
GFR, Estimated: >60 mL/min (ref >60)
Glucose, Bld: 98 mg/dL (ref 70–99)
Potassium: 3.9 mmol/L (ref 3.5–5.1)
Sodium: 140 mmol/L (ref 135–145)

## 2022-01-11 LAB — CBC
HCT: 36.2 % (ref 36.0–46.0)
Hemoglobin: 11.3 g/dL — ABNORMAL LOW (ref 12.0–15.0)
MCH: 25.3 pg — ABNORMAL LOW (ref 26.0–34.0)
MCHC: 31.2 g/dL (ref 30.0–36.0)
MCV: 81 fL (ref 80.0–100.0)
Platelets: 274 10*3/uL (ref 150–400)
RBC: 4.47 MIL/uL (ref 3.87–5.11)
RDW: 17.8 % — ABNORMAL HIGH (ref 11.5–15.5)
WBC: 6.4 10*3/uL (ref 4.0–10.5)
nRBC: 0 % (ref 0.0–0.2)

## 2022-01-11 LAB — POCT PREGNANCY, URINE: Preg Test, Ur: NEGATIVE

## 2022-01-11 LAB — TYPE AND SCREEN
ABO/RH(D): A POS
Antibody Screen: NEGATIVE

## 2022-01-11 LAB — RPR: RPR Ser Ql: NONREACTIVE

## 2022-01-11 SURGERY — LIGATION, FALLOPIAN TUBE, LAPAROSCOPIC
Anesthesia: General | Site: Abdomen | Laterality: Bilateral

## 2022-01-11 MED ORDER — FAMOTIDINE 20 MG PO TABS
ORAL_TABLET | ORAL | Status: AC
  Administered 2022-01-11: 20 mg via ORAL
  Filled 2022-01-11: qty 1

## 2022-01-11 MED ORDER — PROMETHAZINE HCL 25 MG/ML IJ SOLN: 6.2500 mg | INTRAMUSCULAR | Status: DC | PRN

## 2022-01-11 MED ORDER — 0.9 % SODIUM CHLORIDE (POUR BTL) OPTIME
TOPICAL | Status: DC | PRN
  Administered 2022-01-11: 500 mL

## 2022-01-11 MED ORDER — ORAL CARE MOUTH RINSE: 15.0000 mL | Freq: Once | OROMUCOSAL | Status: AC

## 2022-01-11 MED ORDER — FENTANYL CITRATE (PF) 100 MCG/2ML IJ SOLN
INTRAMUSCULAR | Status: DC | PRN
  Administered 2022-01-11 (×2): 50 ug via INTRAVENOUS

## 2022-01-11 MED ORDER — ONDANSETRON HCL 4 MG/2ML IJ SOLN
INTRAMUSCULAR | Status: DC | PRN
  Administered 2022-01-11: 4 mg via INTRAVENOUS

## 2022-01-11 MED ORDER — SILVER NITRATE-POT NITRATE 75-25 % EX MISC
CUTANEOUS | Status: AC
  Filled 2022-01-11: qty 10

## 2022-01-11 MED ORDER — DROPERIDOL 2.5 MG/ML IJ SOLN
INTRAMUSCULAR | Status: AC
  Administered 2022-01-11: 0.625 mg via INTRAVENOUS
  Filled 2022-01-11: qty 2

## 2022-01-11 MED ORDER — PROPOFOL 10 MG/ML IV BOLUS
INTRAVENOUS | Status: DC | PRN
  Administered 2022-01-11: 50 mg via INTRAVENOUS
  Administered 2022-01-11: 150 mg via INTRAVENOUS

## 2022-01-11 MED ORDER — POVIDONE-IODINE 10 % EX SWAB: 2.0000 | Freq: Once | CUTANEOUS | Status: DC

## 2022-01-11 MED ORDER — ONDANSETRON HCL 4 MG/2ML IJ SOLN
INTRAMUSCULAR | Status: AC
  Filled 2022-01-11: qty 2

## 2022-01-11 MED ORDER — GABAPENTIN 300 MG PO CAPS: 300.0000 mg | ORAL_CAPSULE | Freq: Every day | ORAL | 0 refills | Status: AC

## 2022-01-11 MED ORDER — OXYCODONE HCL 5 MG/5ML PO SOLN: 5.0000 mg | Freq: Once | ORAL | Status: DC | PRN

## 2022-01-11 MED ORDER — LACTATED RINGERS IV SOLN
INTRAVENOUS | Status: DC
Start: 2022-01-11 — End: 2022-01-11

## 2022-01-11 MED ORDER — ACETAMINOPHEN 10 MG/ML IV SOLN: 1000.0000 mg | Freq: Once | INTRAVENOUS | Status: DC | PRN

## 2022-01-11 MED ORDER — ACETAMINOPHEN 500 MG PO TABS: 1000.0000 mg | ORAL_TABLET | Freq: Four times a day (QID) | ORAL | 0 refills | Status: AC

## 2022-01-11 MED ORDER — MIDAZOLAM HCL 2 MG/2ML IJ SOLN
INTRAMUSCULAR | Status: AC
  Filled 2022-01-11: qty 2

## 2022-01-11 MED ORDER — DROPERIDOL 2.5 MG/ML IJ SOLN: 0.6250 mg | Freq: Once | INTRAMUSCULAR | Status: AC | PRN

## 2022-01-11 MED ORDER — OXYCODONE HCL 5 MG PO TABS: 5.0000 mg | ORAL_TABLET | ORAL | 0 refills | Status: AC | PRN

## 2022-01-11 MED ORDER — BUPIVACAINE HCL (PF) 0.5 % IJ SOLN
INTRAMUSCULAR | Status: AC
  Filled 2022-01-11: qty 30

## 2022-01-11 MED ORDER — PROPOFOL 10 MG/ML IV BOLUS
INTRAVENOUS | Status: AC
  Filled 2022-01-11: qty 20

## 2022-01-11 MED ORDER — FAMOTIDINE 20 MG PO TABS: 20.0000 mg | ORAL_TABLET | Freq: Once | ORAL | Status: AC

## 2022-01-11 MED ORDER — FENTANYL CITRATE (PF) 100 MCG/2ML IJ SOLN: 25.0000 ug | INTRAMUSCULAR | Status: DC | PRN

## 2022-01-11 MED ORDER — DOCUSATE SODIUM 100 MG PO CAPS: 100.0000 mg | ORAL_CAPSULE | Freq: Two times a day (BID) | ORAL | 0 refills | Status: AC

## 2022-01-11 MED ORDER — FENTANYL CITRATE (PF) 100 MCG/2ML IJ SOLN
INTRAMUSCULAR | Status: AC
  Filled 2022-01-11: qty 2

## 2022-01-11 MED ORDER — ACETAMINOPHEN 10 MG/ML IV SOLN
INTRAVENOUS | Status: DC | PRN
  Administered 2022-01-11: 1000 mg via INTRAVENOUS

## 2022-01-11 MED ORDER — DEXAMETHASONE SODIUM PHOSPHATE 10 MG/ML IJ SOLN
INTRAMUSCULAR | Status: DC | PRN
  Administered 2022-01-11: 10 mg via INTRAVENOUS

## 2022-01-11 MED ORDER — IBUPROFEN 800 MG PO TABS: 800.0000 mg | ORAL_TABLET | Freq: Three times a day (TID) | ORAL | 1 refills | Status: AC

## 2022-01-11 MED ORDER — CHLORHEXIDINE GLUCONATE 0.12 % MT SOLN
OROMUCOSAL | Status: AC
  Administered 2022-01-11: 15 mL via OROMUCOSAL
  Filled 2022-01-11: qty 15

## 2022-01-11 MED ORDER — SUGAMMADEX SODIUM 200 MG/2ML IV SOLN
INTRAVENOUS | Status: DC | PRN
  Administered 2022-01-11: 200 mg via INTRAVENOUS

## 2022-01-11 MED ORDER — ROCURONIUM BROMIDE 100 MG/10ML IV SOLN
INTRAVENOUS | Status: DC | PRN
  Administered 2022-01-11: 50 mg via INTRAVENOUS

## 2022-01-11 MED ORDER — KETOROLAC TROMETHAMINE 30 MG/ML IJ SOLN
INTRAMUSCULAR | Status: AC
  Filled 2022-01-11: qty 1

## 2022-01-11 MED ORDER — MIDAZOLAM HCL 2 MG/2ML IJ SOLN
INTRAMUSCULAR | Status: DC | PRN
  Administered 2022-01-11: 2 mg via INTRAVENOUS

## 2022-01-11 MED ORDER — OXYCODONE HCL 5 MG PO TABS: 5.0000 mg | ORAL_TABLET | Freq: Once | ORAL | Status: DC | PRN

## 2022-01-11 MED ORDER — DEXAMETHASONE SODIUM PHOSPHATE 10 MG/ML IJ SOLN
INTRAMUSCULAR | Status: AC
  Filled 2022-01-11: qty 1

## 2022-01-11 MED ORDER — LIDOCAINE HCL (PF) 2 % IJ SOLN
INTRAMUSCULAR | Status: AC
  Filled 2022-01-11: qty 5

## 2022-01-11 MED ORDER — ROCURONIUM BROMIDE 10 MG/ML (PF) SYRINGE
PREFILLED_SYRINGE | INTRAVENOUS | Status: AC
  Filled 2022-01-11: qty 10

## 2022-01-11 MED ORDER — KETOROLAC TROMETHAMINE 30 MG/ML IJ SOLN
INTRAMUSCULAR | Status: DC | PRN
  Administered 2022-01-11: 30 mg via INTRAVENOUS

## 2022-01-11 MED ORDER — CHLORHEXIDINE GLUCONATE 0.12 % MT SOLN: 15.0000 mL | Freq: Once | OROMUCOSAL | Status: AC

## 2022-01-11 MED ORDER — LIDOCAINE HCL (CARDIAC) PF 100 MG/5ML IV SOSY
PREFILLED_SYRINGE | INTRAVENOUS | Status: DC | PRN
  Administered 2022-01-11: 80 mg via INTRAVENOUS

## 2022-01-11 MED ORDER — BUPIVACAINE HCL 0.5 % IJ SOLN
INTRAMUSCULAR | Status: DC | PRN
  Administered 2022-01-11: 20 mL

## 2022-01-11 MED ORDER — ACETAMINOPHEN 10 MG/ML IV SOLN
INTRAVENOUS | Status: AC
  Filled 2022-01-11: qty 100

## 2022-01-11 MED ORDER — LACTATED RINGERS IV SOLN: INTRAVENOUS | Status: DC

## 2022-01-11 SURGICAL SUPPLY — 37 items
ADH SKN CLS APL DERMABOND .7 (GAUZE/BANDAGES/DRESSINGS) ×1
APL PRP STRL LF DISP 70% ISPRP (MISCELLANEOUS) ×1
BACTOSHIELD CHG 4% 4OZ (MISCELLANEOUS) ×1
BLADE SURG SZ11 CARB STEEL (BLADE) ×2 IMPLANT
CATH ROBINSON RED A/P 16FR (CATHETERS) ×2 IMPLANT
CHLORAPREP W/TINT 26 (MISCELLANEOUS) ×2 IMPLANT
DERMABOND ADVANCED (GAUZE/BANDAGES/DRESSINGS) ×1
DERMABOND ADVANCED .7 DNX12 (GAUZE/BANDAGES/DRESSINGS) ×1 IMPLANT
DRAPE UTILITY 15X26 TOWEL STRL (DRAPES) ×4 IMPLANT
GAUZE 4X4 16PLY ~~LOC~~+RFID DBL (SPONGE) IMPLANT
GLOVE BIO SURGEON STRL SZ7 (GLOVE) ×4 IMPLANT
GLOVE SURG UNDER LTX SZ7.5 (GLOVE) ×4 IMPLANT
GOWN STRL REUS W/ TWL LRG LVL3 (GOWN DISPOSABLE) ×2 IMPLANT
GOWN STRL REUS W/TWL LRG LVL3 (GOWN DISPOSABLE) ×4
GRASPER SUT TROCAR 14GX15 (MISCELLANEOUS) ×2 IMPLANT
KIT PINK PAD W/HEAD ARE REST (MISCELLANEOUS) ×2
KIT PINK PAD W/HEAD ARM REST (MISCELLANEOUS) ×1 IMPLANT
KIT TURNOVER CYSTO (KITS) ×2 IMPLANT
LABEL OR SOLS (LABEL) ×2 IMPLANT
LIGASURE VESSEL 5MM BLUNT TIP (ELECTROSURGICAL) IMPLANT
MANIFOLD NEPTUNE II (INSTRUMENTS) ×2 IMPLANT
NS IRRIG 500ML POUR BTL (IV SOLUTION) ×2 IMPLANT
PACK GYN LAPAROSCOPIC (MISCELLANEOUS) ×2 IMPLANT
PAD OB MATERNITY 4.3X12.25 (PERSONAL CARE ITEMS) ×2 IMPLANT
PAD PREP 24X41 OB/GYN DISP (PERSONAL CARE ITEMS) ×2 IMPLANT
SCRUB CHG 4% DYNA-HEX 4OZ (MISCELLANEOUS) ×1 IMPLANT
SEALER TISSUE G2 STRG ARTC 35C (ENDOMECHANICALS) ×1 IMPLANT
SET TUBE SMOKE EVAC HIGH FLOW (TUBING) ×2 IMPLANT
SLEEVE ENDOPATH XCEL 5M (ENDOMECHANICALS) ×4 IMPLANT
STRIP CLOSURE SKIN 1/4X4 (GAUZE/BANDAGES/DRESSINGS) IMPLANT
SUT MNCRL 4-0 (SUTURE) ×2
SUT MNCRL 4-0 27XMFL (SUTURE) ×1
SUT VIC AB 0 UR5 27 (SUTURE) ×2 IMPLANT
SUT VIC AB 2-0 UR6 27 (SUTURE) ×2 IMPLANT
SUTURE MNCRL 4-0 27XMF (SUTURE) ×1 IMPLANT
TROCAR XCEL NON-BLD 5MMX100MML (ENDOMECHANICALS) ×2 IMPLANT
WATER STERILE IRR 500ML POUR (IV SOLUTION) ×2 IMPLANT

## 2022-01-11 NOTE — Discharge Instructions (Addendum)
Laparoscopic Tubal Ligation Discharge Instructions  Laparoscopic tubal ligation and fulguration ties your fallopian tubes to prevent pregnancy in the future.   For the next three days, take ibuprofen and acetaminophen on a schedule, every 8 hours. You can take them together or you can intersperse them, and take one every four hours. I also gave you gabapentin for nighttime, to help you sleep and also to control pain. Take gabapentin medicines at night for at least the next 3 nights. You also have a narcotic, oxycodone, to take as needed if the above medicines don't help.  Postop constipation is a major cause of pain. Stay well hydrated, walk as you tolerate, and take over the counter senna as well as stool softeners if you need them.  RISKS AND COMPLICATIONS  Infection. Bleeding. Injury to surrounding organs. Anesthetic side effects. Failure of the procedure. Risks of future ectopic pregnancy  PROCEDURE  You may be given a medicine to help you relax (sedative) before the procedure. You will be given a medicine to make you sleep (general anesthetic) during the procedure. A tube will be put down your throat to help your breath while under general anesthesia. Two small cuts (incisions) are made in the lower abdominal area and one incision is made near the belly button. Your abdominal area will be inflated with a safe gas (carbon dioxide). This helps give the surgeon room to operate, visualize, and helps the surgeon avoid other organs. A thin, lighted tube (laparoscope) with a camera attached is inserted into your abdomen through the incision near the belly button. Other small instruments may also be inserted through other abdominal incisions. The fallopian tube is located and are removed. After the fallopian tube is removed, the gas is released from the abdomen. The incisions will be closed with stitches (sutures), and Dermabond. A bandage may be placed over the incisions.  AFTER THE  PROCEDURE  You will also have some mild abdominal discomfort for 3-7 days. You will be given pain medicine to ease any discomfort. As long as there are no problems, you may be allowed to go home. Someone will need to drive you home and be with you for at least 24 hours once home. You may have some mild discomfort in the throat. This is from the tube placed in your throat while you were sleeping. You may experience discomfort in the shoulder area from some trapped air between the liver and diaphragm. This sensation is normal and will slowly go away on its own.  HOME CARE INSTRUCTIONS  Take all medicines as directed. Only take over-the-counter or prescription medicines for pain, discomfort, or fever as directed by your caregiver. Resume daily activities as directed. Showers are preferred over baths. You may resume sexual activities in 1 week or as directed. Do not drive while taking narcotics.  SEEK MEDICAL CARE IF: . There is increasing abdominal pain. You feel lightheaded or faint. You have the chills. You have an oral temperature above 102 F (38.9 C). There is pus-like (purulent) drainage from any of the wounds. You are unable to pass gas or have a bowel movement. You feel sick to your stomach (nauseous) or throw up (vomit).  MAKE SURE YOU:  Understand these instructions. Will watch your condition. Will get help right away if you are not doing well or get worse.  Exit[employer] Patient Information 2013 [employer]   AMBULATORY SURGERY  DISCHARGE INSTRUCTIONS   The drugs that you were given will stay in your system until tomorrow so   for the next 24 hours you should not:  Drive an automobile Make any legal decisions Drink any alcoholic beverage   You may resume regular meals tomorrow.  Today it is better to start with liquids and gradually work up to solid foods.  You may eat anything you prefer, but it is better to start with liquids, then soup and crackers, and gradually  work up to solid foods.   Please notify your doctor immediately if you have any unusual bleeding, trouble breathing, redness and pain at the surgery site, drainage, fever, or pain not relieved by medication.    Additional Instructions:   Please contact your physician with any problems or Same Day Surgery at [phone], Monday through Friday 6 am to 4 pm, or [facility] Main number at [phone].    

## 2022-01-11 NOTE — Anesthesia Preprocedure Evaluation (Addendum)
Anesthesia Evaluation  Patient identified by MRN, date of birth, ID band Patient awake    Reviewed: Allergy & Precautions, NPO status , Patient's Chart, lab work & pertinent test results  Airway Mallampati: III  TM Distance: >3 FB Neck ROM: full    Dental no notable dental hx.    Pulmonary neg pulmonary ROS,    Pulmonary exam normal        Cardiovascular negative cardio ROS Normal cardiovascular exam     Neuro/Psych Seizures - (2018),  PSYCHIATRIC DISORDERS Anxiety Depression    GI/Hepatic negative GI ROS, Neg liver ROS,   Endo/Other  negative endocrine ROS  Renal/GU      Musculoskeletal   Abdominal (+) + obese,   Peds  Hematology negative hematology ROS (+)   Anesthesia Other Findings Past Medical History: No date: Anemia No date: Anxiety 11/13/2021: Drug overdose No date: GERD (gastroesophageal reflux disease)     Comment:  rare No date: Seizures (HCC)     Comment:  last seizure in 2018 No date: Suicide attempt [facility])  Past Surgical History: No date: NO PAST SURGERIES  BMI    Body Mass Index: 35.81 kg/m      Reproductive/Obstetrics negative OB ROS                            Anesthesia Physical Anesthesia Plan  ASA: 2  Anesthesia Plan: General ETT   Post-op Pain Management: Toradol IV (intra-op)* and Ofirmev IV (intra-op)*   Induction: Intravenous  PONV Risk Score and Plan: 4 or greater and Ondansetron, Dexamethasone, Midazolam and Promethazine  Airway Management Planned: Oral ETT  Additional Equipment:   Intra-op Plan:   Post-operative Plan: Extubation in OR  Informed Consent: I have reviewed the patients History and Physical, chart, labs and discussed the procedure including the risks, benefits and alternatives for the proposed anesthesia with the patient or authorized representative who has indicated his/her understanding and acceptance.     Dental Advisory  Given  Plan Discussed with: Anesthesiologist, CRNA and Surgeon  Anesthesia Plan Comments:        Anesthesia Quick Evaluation

## 2022-01-11 NOTE — Transfer of Care (Signed)
Immediate Anesthesia Transfer of Care Note  Patient: Kathleen Mcgrath  Procedure(s) Performed: LAPAROSCOPIC BILATERAL TUBAL LIGATION (Bilateral: Abdomen)  Patient Location: PACU  Anesthesia Type:General  Level of Consciousness: drowsy  Airway & Oxygen Therapy: Patient Spontanous Breathing and Patient connected to face mask oxygen  Post-op Assessment: Report given to RN and Post -op Vital signs reviewed and stable  Post vital signs: Reviewed and stable  Last Vitals:  Vitals Value Taken Time  BP 109/76 01/11/22 0900  Temp    Pulse 83 01/11/22 0903  Resp 14 01/11/22 0903  SpO2 100 % 01/11/22 0903  Vitals shown include unvalidated device data.  Last Pain:  Vitals:   01/11/22 0626  TempSrc: Temporal  PainSc: 0-No pain         Complications: No notable events documented.

## 2022-01-11 NOTE — Interval H&P Note (Signed)
History and Physical Interval Note:  01/11/2022 7:36 AM  Kathleen Mcgrath  has presented today for surgery, with the diagnosis of undesired fertility.  The various methods of treatment have been discussed with the patient and family. After consideration of risks, benefits and other options for treatment, the patient has consented to  Procedure(s): LAPAROSCOPIC BILATERAL TUBAL LIGATION (Bilateral) as a surgical intervention.  The patient's history has been reviewed, patient examined, no change in status, stable for surgery.  I have reviewed the patient's chart and labs.  Questions were answered to the patient's satisfaction.     Christeen Douglas

## 2022-01-11 NOTE — Anesthesia Procedure Notes (Signed)
Procedure Name: Intubation Date/Time: 01/11/2022 7:51 AM  Performed by: Hezzie Bump, CRNAPre-anesthesia Checklist: Patient identified, Patient being monitored, Timeout performed, Emergency Drugs available and Suction available Patient Re-evaluated:Patient Re-evaluated prior to induction Oxygen Delivery Method: Circle system utilized Preoxygenation: Pre-oxygenation with 100% oxygen Induction Type: IV induction Ventilation: Mask ventilation without difficulty Laryngoscope Size: 3 and McGraph Grade View: Grade I Tube type: Oral Tube size: 6.5 mm Number of attempts: 1 Airway Equipment and Method: Stylet Placement Confirmation: ETT inserted through vocal cords under direct vision, positive ETCO2 and breath sounds checked- equal and bilateral Secured at: 21 cm Tube secured with: Tape Dental Injury: Teeth and Oropharynx as per pre-operative assessment

## 2022-01-11 NOTE — H&P (Signed)
Kathleen Mcgrath is a 26 y.o. female [phone] here for Pre Op Consulting (Surgical consult - discuss BTL) .   History of Present Illness: Preop Visit for Interval BTL   The patient is postpartum from a NSVD delivery on 11/09/21 with [name], CNM. She presents to discuss permanent sterilization. In her own words, " I don't care how you do it, as long as I don't get pregnant anymore. I'm done"    Pertinent Hx: - 1 VAVD, 3 SVD  - Postpartum depression, started on Prozac and Atarax 11/2021  - Last pap 03/2020 neg   Past Medical History:  has a past medical history of Seizures (CMS-HCC).  Past Surgical History:  has no past surgical history on file. Family History: family history includes Breast cancer in her maternal grandmother; Diabetes in her mother; No Known Problems in her father. Social History:  reports that she has never smoked. She has never used smokeless tobacco. She reports that she does not drink alcohol and does not use drugs. OB/GYN History:  OB History       Gravida  6   Para  4   Term  4   Preterm      AB  2   Living  4        SAB  2   IAB      Ectopic      Molar      Multiple      Live Births  4             Allergies: is allergic to phenytoin sodium. Medications:   Current Outpatient Medications:    FLUoxetine (PROZAC) 20 MG capsule, Take 20 mg by mouth once daily, Disp: , Rfl:    hydrOXYzine (ATARAX) 25 MG tablet, Take 25 mg by mouth 3 (three) times daily as needed, Disp: , Rfl:    lamoTRIgine (LAMICTAL) 200 MG tablet, TAKE 1 TABLET BY MOUTH TWICE A DAY, Disp: 180 tablet, Rfl: 3   levETIRAcetam (KEPPRA) 250 MG tablet, Take 1 tablet (250 mg total) by mouth 2 (two) times daily, Disp: 180 tablet, Rfl: 3   prenatal vit-iron fum-folic ac (PRENAVITE) tablet, Take 1 tablet by mouth once daily (Patient not taking: Reported on 12/01/2021), Disp: 30 tablet, Rfl: 11   sertraline (ZOLOFT) 100 MG tablet, Take 1 tablet (100 mg total) by mouth once daily  (Patient not taking: Reported on 12/01/2021), Disp: 30 tablet, Rfl: 11   Review of Systems: No SOB, no palpitations or chest pain, no new lower extremity edema, no nausea or vomiting or bowel or bladder complaints. See HPI for gyn specific ROS.    Exam:    BP 115/80   Pulse 94   Ht 157.5 cm (5\' 2" )   Wt 88.8 kg (195 lb 12.8 oz)   LMP 12/13/2021 (Approximate)   BMI 35.81 kg/m    General: Patient is well-groomed, well-nourished, appears stated age in no acute distress   HEENT: head is atraumatic and normocephalic, trachea is midline, neck is supple with no palpable nodules   CV: Regular rhythm and normal heart rate, no murmur   Pulm: Clear to auscultation throughout lung fields with no wheezing, crackles, or rhonchi. No increased work of breathing   Abdomen: soft , no mass, non-tender, no rebound tenderness, no hepatomegaly   Pelvic:  Deferred   Impression:    The encounter diagnosis was Unwanted fertility.   Plan:    1. Request for Permanent Sterilization  -Patient desires surgical sterilization.  Patient has been counseled on alternate forms of contraception including hormonal forms, IUD's and barrier methods. She has been counseled on risks of surgical sterilization including bleeding, infection, pain, injury during procedure, risk of need for further procedures/surgeries due to injury or abnormalities at the time of surgery, thromboembolic events, exacerbation of ongoing medical conditions, risk of ectopic pregnancy, risk of failure of procedure to prevent pregnancy, medication reactions as well as the risk of anesthesia.  Patient verbalizes understanding.  Consent form signed.  Preoperative and postoperative instructions provided. Written and verbal education provided.  No barriers to learning.

## 2022-01-11 NOTE — Op Note (Signed)
Kathleen Mcgrath 01/11/2022  PREOPERATIVE DIAGNOSIS:  Undesired fertility  POSTOPERATIVE DIAGNOSIS:  Undesired fertility  PROCEDURE:  Laparoscopic Bilateral Tubal Sterilization using Bipolar Coagulation with partial salpingectomy, including fimbriae   ANESTHESIA:  General endotracheal  ANESTHESIOLOGIST: Foye Deer, MD Anesthesiologist: Foye Deer, MD CRNA: Hezzie Bump, CRNA  SURGEON: Cline Cools, MD  COMPLICATIONS:  None immediate.  ESTIMATED BLOOD LOSS:  Less than 20 ml.  FLUIDS: 800 ml LR.  URINE OUTPUT:  50 ml of clear urine.  INDICATIONS: 26 y.o. [id number]  with undesired fertility, desires permanent sterilization. Other reversible forms of contraception were discussed with patient; she declines all other modalities.  Risks of procedure discussed with patient including permanence of method, bleeding, infection, injury to surrounding organs and need for additional procedures including laparotomy, risk of regret.  Failure risk of 0.5-1% with increased risk of ectopic gestation if pregnancy occurs was also discussed with patient.      FINDINGS:  Normal uterus, tubes, and ovaries.  TECHNIQUE:  The patient was taken to the operating room where general anesthesia was obtained without difficulty.  She was then placed in the dorsal lithotomy position and prepared and draped in sterile fashion.  The bladder was cathed for an estimated amount of clear urine. After an adequate timeout was performed, a bivalved speculum was then placed in the patient's vagina, and the anterior lip of cervix grasped with the single-tooth tenaculum.  The uterine manipulator was then advanced into the uterus.  The speculum was removed from the vagina.   Attention was then turned to the patient's abdomen where a 5-mm skin incision was made in the umbilical fold.  The Optiview 5-mm trocar and sleeve were then advanced without difficulty with the laparoscope under direct visualization  into the abdomen.  The abdomen was then insufflated with carbon dioxide gas and adequate pneumoperitoneum was obtained.  A survey of the patient's pelvis and abdomen revealed entirely normal anatomy.     The fallopian tubes were observed and found to be normal in appearance. A 51mm port was placed in the bilateral lower quadrants under direct visualization. A Enseal device was then advanced through the operative port and used to coagulate and excise the distal portion of the Fallopian tube, including the fimbriated ends.  Good blanching and coagulation was noted at the site of the application.  There was no bleeding noted in the mesosalpinx.  A similar process was carried out on the right fallopian tube allowing for bilateral tubal sterilization.   Good hemostasis was noted overall.    The instruments were then removed from the patient's abdomen and the skin was closed with Dermabond.  The uterine manipulator and the tenaculum were removed from the vagina without complications. Silver nitrate used for hemostasis on the cervix, and the patient was on her period so blood was noted in the vaginal vault at the beginning and from the os at the end of the case. The patient tolerated the procedure well.  Sponge, lap, and needle counts were correct times two.  The patient was then taken to the recovery room awake, extubated and in stable condition.

## 2022-01-12 LAB — SURGICAL PATHOLOGY

## 2022-01-12 NOTE — Anesthesia Postprocedure Evaluation (Signed)
Anesthesia Post Note  Patient: Kathleen Mcgrath  Procedure(s) Performed: LAPAROSCOPIC BILATERAL TUBAL LIGATION (Bilateral: Abdomen)  Patient location during evaluation: PACU Anesthesia Type: General Level of consciousness: awake and alert Pain management: pain level controlled Vital Signs Assessment: post-procedure vital signs reviewed and stable Respiratory status: spontaneous breathing, nonlabored ventilation and respiratory function stable Cardiovascular status: blood pressure returned to baseline and stable Postop Assessment: no apparent nausea or vomiting Anesthetic complications: no   No notable events documented.   Last Vitals:  Vitals:   01/11/22 0940 01/11/22 0951  BP: (!) 113/47 (!) 122/54  Pulse: 69 72  Resp: 13 18  Temp:    SpO2: 97% 100%    Last Pain:  Vitals:   01/11/22 0935  TempSrc:   PainSc: 0-No pain                 Foye Deer
